# Patient Record
Sex: Female | Born: 1985
Health system: Southern US, Community
[De-identification: ages and names within clinical notes are randomized; demographics above are authoritative.]

## PROBLEM LIST (undated history)

## (undated) DIAGNOSIS — F319 Bipolar disorder, unspecified: Secondary | ICD-10-CM

## (undated) DIAGNOSIS — F988 Other specified behavioral and emotional disorders with onset usually occurring in childhood and adolescence: Secondary | ICD-10-CM

## (undated) DIAGNOSIS — K3184 Gastroparesis: Secondary | ICD-10-CM

## (undated) DIAGNOSIS — E876 Hypokalemia: Secondary | ICD-10-CM

## (undated) DIAGNOSIS — N289 Disorder of kidney and ureter, unspecified: Secondary | ICD-10-CM

## (undated) DIAGNOSIS — J45909 Unspecified asthma, uncomplicated: Secondary | ICD-10-CM

## (undated) HISTORY — PX: TONSILLECTOMY: SUR1361

---

## 2016-11-05 ENCOUNTER — Emergency Department (HOSPITAL_COMMUNITY)
Admission: EM | Admit: 2016-11-05 | Discharge: 2016-11-05 | Disposition: A | Discharge status: Home / Self Care | Payer: Self-pay | Attending: Emergency Medicine | Admitting: Emergency Medicine

## 2016-11-05 ENCOUNTER — Encounter (HOSPITAL_COMMUNITY): Payer: Self-pay

## 2016-11-05 ENCOUNTER — Emergency Department (HOSPITAL_COMMUNITY): Payer: Self-pay

## 2016-11-05 DIAGNOSIS — Y929 Unspecified place or not applicable: Secondary | ICD-10-CM | POA: Insufficient documentation

## 2016-11-05 DIAGNOSIS — S62101A Fracture of unspecified carpal bone, right wrist, initial encounter for closed fracture: Secondary | ICD-10-CM | POA: Insufficient documentation

## 2016-11-05 DIAGNOSIS — J45909 Unspecified asthma, uncomplicated: Secondary | ICD-10-CM | POA: Insufficient documentation

## 2016-11-05 DIAGNOSIS — J069 Acute upper respiratory infection, unspecified: Secondary | ICD-10-CM | POA: Insufficient documentation

## 2016-11-05 DIAGNOSIS — M25531 Pain in right wrist: Secondary | ICD-10-CM

## 2016-11-05 DIAGNOSIS — R05 Cough: Secondary | ICD-10-CM

## 2016-11-05 DIAGNOSIS — Y990 Civilian activity done for income or pay: Secondary | ICD-10-CM | POA: Insufficient documentation

## 2016-11-05 DIAGNOSIS — F909 Attention-deficit hyperactivity disorder, unspecified type: Secondary | ICD-10-CM | POA: Insufficient documentation

## 2016-11-05 DIAGNOSIS — Y939 Activity, unspecified: Secondary | ICD-10-CM | POA: Insufficient documentation

## 2016-11-05 DIAGNOSIS — R059 Cough, unspecified: Secondary | ICD-10-CM

## 2016-11-05 DIAGNOSIS — Z87891 Personal history of nicotine dependence: Secondary | ICD-10-CM | POA: Insufficient documentation

## 2016-11-05 DIAGNOSIS — X58XXXA Exposure to other specified factors, initial encounter: Secondary | ICD-10-CM | POA: Insufficient documentation

## 2016-11-05 HISTORY — DX: Hypokalemia: E87.6

## 2016-11-05 HISTORY — DX: Other specified behavioral and emotional disorders with onset usually occurring in childhood and adolescence: F98.8

## 2016-11-05 HISTORY — DX: Disorder of kidney and ureter, unspecified: N28.9

## 2016-11-05 HISTORY — DX: Bipolar disorder, unspecified: F31.9

## 2016-11-05 HISTORY — DX: Gastroparesis: K31.84

## 2016-11-05 HISTORY — DX: Unspecified asthma, uncomplicated: J45.909

## 2016-11-05 LAB — RAPID STREP SCREEN (MED CTR MEBANE ONLY): STREPTOCOCCUS, GROUP A SCREEN (DIRECT): NEGATIVE

## 2016-11-05 MED ORDER — ALBUTEROL SULFATE HFA 108 (90 BASE) MCG/ACT IN AERS
2.0000 | INHALATION_SPRAY | Freq: Once | RESPIRATORY_TRACT | Status: AC
Start: 2016-11-05 — End: 2016-11-05
  Administered 2016-11-05: 2 via RESPIRATORY_TRACT
  Filled 2016-11-05 (×2): qty 6.7

## 2016-11-05 NOTE — ED Triage Notes (Signed)
Pt c/o sore throat and generalized body aches x 1 day and R wrist pain x "a couple days."  Pain score 10/10.  Pt has not taken anything for symptoms.  Pt reported that she hyperextended wrist at work.

## 2016-11-05 NOTE — ED Provider Notes (Signed)
[facility]-EMERGENCY DEPT Provider Note   CSN: [id number] Arrival date & time: 11/05/16  [id number]  By signing my name below, I, Brandi Howard, attest that this documentation has been prepared under the direction and in the presence of non-physician practitioner, Sherylann Vangorden Camprubi-Soms, PA-C. Electronically Signed: Majel HomerPeyton Howard, Scribe. 11/05/2016. 10:53 AM.  History   Chief Complaint Chief Complaint  Patient presents with  . Sore Throat  . Generalized Body Aches  . Wrist Injury   The history is provided by the patient and medical records. No language interpreter was used.  Sore Throat  This is a new problem. The current episode started 2 days ago. The problem occurs constantly. The problem has been gradually worsening. Pertinent negatives include no chest pain, no abdominal pain and no shortness of breath. The symptoms are aggravated by coughing. Nothing relieves the symptoms. Treatments tried: nyquil. The treatment provided no relief.  Wrist Injury   The incident occurred yesterday. The incident occurred at work. The injury mechanism is unknown. The pain is present in the right wrist. The pain is at a severity of 10/10. Pertinent negatives include no fever. She has tried nothing for the symptoms.   HPI Comments: Brandi Howard is a 30 y.o. female with PMHx of asthma and renal insufficiency, who presents to the Emergency Department for an evaluation of gradually worsening sore throat and generalized myalgias that began 2 days ago. Pt reports she woke up 2 days ago and noticed "white spots" near the back of her throat. 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 She states she went to bed that evening and woke up yesterday morning with generalized body aches. She states associated chills, cough productive of yellow/green mucous, chest tightness secondary to her cough, wheezing, rhinorrhea with yellow drainage, and sensation of ear pressure. Pt reports  her sore throat and chest tightness is exacerbated when breathing in cold air. She notes she has taken NyQuil with no relief and states she does not currently have an albuterol inhaler for her asthma. Denies fevers, CP, SOB, drooling, difficulty swallowing, trismus, ear pain/drainage, abd pain, N/V/D/C, hematuria, dysuria, numbness, tingling, weakness, or rashes.  Pt also complains of gradually worsening, 10/10, right wrist pain that began 2 days ago while at work. She believes she may have "hyperextended" her wrist. She notes associated swelling and "tightness" near her wrist but denies numbness. Denies bruising, or any other symptoms. No prior injury to this wrist that she can recall.  Past Medical History:  Diagnosis Date  . ADD (attention deficit disorder)   . Asthma   . Bipolar disorder (HCC)   . Gastroparesis   . Potassium (K) deficiency   . Renal insufficiency    There are no active problems to display for this patient.  Past Surgical History:  Procedure Laterality Date  . TONSILLECTOMY      OB History    No data available     Home Medications    Prior to Admission medications   Not on File    Family History History reviewed. No pertinent family history.  Social History Social History  Substance Use Topics  . Smoking status: Former Games developermoker  . Smokeless tobacco: Never Used  . Alcohol use No   Allergies   Patient has no allergy information on record.  Review of Systems Review of Systems  Constitutional: Positive for chills. Negative for fever.  HENT: Positive for rhinorrhea and sore throat. Negative  for drooling, ear discharge, ear pain and trouble swallowing.   Respiratory: Positive for cough and wheezing. Negative for shortness of breath.   Cardiovascular: Negative for chest pain.  Gastrointestinal: Negative for abdominal pain, constipation, diarrhea, nausea and vomiting.  Genitourinary: Negative for dysuria and hematuria.  Musculoskeletal: Positive for  arthralgias (right wrist), joint swelling (R wrist) and myalgias (generalized body aches).  Skin: Negative for color change.  Allergic/Immunologic: Negative for immunocompromised state.  Neurological: Negative for weakness and numbness.  Psychiatric/Behavioral: Negative for confusion.  10 Systems reviewed and are negative for acute change except as noted in the HPI.   Physical Exam Updated Vital Signs BP 116/62 (BP Location: Left Arm)   Pulse 70   Temp 98.2 F (36.8 C) (Oral)   Resp 16   Ht 5\' 5"  (1.651 m)   LMP 11/05/2016   SpO2 99%   Physical Exam  Constitutional: She is oriented to person, place, and time. Vital signs are normal. She appears well-developed and well-nourished.  Non-toxic appearance. No distress.  Afebrile, nontoxic, NAD  HENT:  Head: Normocephalic and atraumatic.  Right Ear: Hearing, tympanic membrane, external ear and ear canal normal.  Left Ear: Hearing, tympanic membrane, external ear and ear canal normal.  Nose: Mucosal edema and rhinorrhea present.  Mouth/Throat: Oropharynx is clear and moist and mucous membranes are normal. No trismus in the jaw.  Uvula and tonsils surgically absent. Ears are clear bilaterally. Nose with mild mucosal edema and rhinorrhea. Oropharynx clear and moist, no trismus or drooling, no exudates or erythema.   Eyes: Conjunctivae and EOM are normal. Right eye exhibits no discharge. Left eye exhibits no discharge.  Neck: Normal range of motion. Neck supple.  Cardiovascular: Normal rate, regular rhythm, normal heart sounds and intact distal pulses.  Exam reveals no gallop and no friction rub.   No murmur heard. Pulmonary/Chest: Effort normal and breath sounds normal. No respiratory distress. She has no decreased breath sounds. She has no wheezes. She has no rhonchi. She has no rales.  CTAB in all lung fields, no w/r/r, no hypoxia or increased WOB, speaking in full sentences, SpO2 99% on RA.  Abdominal: Soft. Normal appearance and bowel  sounds are normal. She exhibits no distension. There is no tenderness. There is no rigidity, no rebound, no guarding, no CVA tenderness, no tenderness at McBurney's point and negative Murphy's sign.  Musculoskeletal: Normal range of motion.  Right wrist with FROM itnact, mild tenderness near the radial aspect of the distal wrist, mild swelling, no bruising or erythema, no warmth, with strength and sensation grossly intact, distal pulses intact, soft compartments.   Neurological: She is alert and oriented to person, place, and time. She has normal strength. No sensory deficit.  Skin: Skin is warm, dry and intact. No rash noted.  Psychiatric: She has a normal mood and affect. Her behavior is normal.  Nursing note and vitals reviewed.  ED Treatments / Results  Labs (all labs ordered are listed, but only abnormal results are displayed) Labs Reviewed  RAPID STREP SCREEN (NOT AT [facility])  CULTURE, GROUP A STREP [facility])    EKG  EKG Interpretation None       Radiology Dg Wrist Complete Right  Result Date: 11/05/2016 CLINICAL DATA:  Pain in the right breast of the hyperextension at work 2 days ago. EXAM: RIGHT WRIST - COMPLETE 3+ VIEW COMPARISON:  None. FINDINGS: There is an age-indeterminate compression fracture of the lunate, with associated widening of the scapholunate interval and evidence of lunate collapse.  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 Electronically Signed   By: Ted Mcalpine M.D.   On: 11/05/2016 10:14    Procedures Procedures (including critical care time)  Medications Ordered in ED Medications  albuterol (PROVENTIL HFA;VENTOLIN HFA) 108 (90 Base) MCG/ACT inhaler 2 puff (not administered)    DIAGNOSTIC STUDIES:  Oxygen Saturation is 99% on RA, normal  by my interpretation.    COORDINATION OF CARE:  10:31 AM Discussed treatment plan with pt at bedside and pt agreed to plan.  Initial Impression / Assessment and Plan / ED Course  I have reviewed the triage vital signs and the nursing notes.  Pertinent labs & imaging results that were available during my care of the patient were reviewed by me and considered in my medical decision making (see chart for details).  Clinical Course     30 y.o. female here with URI symptoms x2-3 days, also with c/o R wrist pain after hyperextending it at work. Pt is afebrile with a clear lung exam. Mild rhinorrhea. RST obtained in triage, which was likely unnecessary since pt has had tonsillectomy and uvulaectomy, but regardless it was obtained and negative. Likely viral URI. As for her wrist injury, NVI with soft compartments, ROM preserved, xray showing age-indeterminate fx of lunate, with arthritic findings; likely old injury since MOI recently doesn't lend itself to this outcome, but will splint and have her f/up with hand specialist. RICE discussed. Tylenol/motrin for pain. Pt is agreeable to symptomatic treatment with close follow up with [facility] to establish care in 1-2wks and for recheck but spoke at length about emergent changing or worsening of symptoms that should prompt return to ER. Will give albuterol here to go home with. Pt voices understanding and is agreeable to plan. Stable at time of discharge.   I personally performed the services described in this documentation, which was scribed in my presence. The recorded information has been reviewed and is accurate.   Final Clinical Impressions(s) / ED Diagnoses   Final diagnoses:  Viral upper respiratory tract infection  Cough  Right wrist pain  Closed fracture of right wrist, initial encounter    New Prescriptions New Prescriptions   No medications on file      Allen Derry, PA-C 11/05/16 1101    Charlynne Pander, MD 11/05/16  1729

## 2016-11-05 NOTE — ED Notes (Signed)
Bed: WTR6 Expected date:  Expected time:  Means of arrival:  Comments: 

## 2016-11-05 NOTE — Discharge Instructions (Signed)
Continue to stay well-hydrated. Gargle warm salt water and spit it out. Use chloraseptic spray as needed for sore throat. Continue to alternate between Tylenol and Ibuprofen for pain or fever. Use Mucinex for cough suppression/expectoration of mucus. Use netipot and flonase to help with nasal congestion. May consider over-the-counter Benadryl or other antihistamine to decrease secretions and for help with your symptoms. Use inhaler as directed, as needed for cough/chest congestion/wheezing/shortness of breath. Follow up with [place] in 5-7 days for recheck of ongoing symptoms and to establish medical care. Return to emergency department for emergent changing or worsening of symptoms.  Wear wrist brace for at least 2 weeks for stabilization of wrist. Ice and elevate wrist throughout the day, using ice pack for no more than 20 minutes every hour.  Alternate between tylenol and motrin for pain relief. Call hand specialist follow up today or tomorrow to schedule followup appointment for recheck of ongoing wrist pain in 1-2 weeks. Return to the ER for changes or worsening symptoms.

## 2016-11-07 LAB — CULTURE, GROUP A STREP (THRC)

## 2016-11-17 ENCOUNTER — Inpatient Hospital Stay: Payer: Self-pay

## 2016-11-23 ENCOUNTER — Encounter: Payer: Self-pay | Admitting: Physician Assistant

## 2016-11-23 ENCOUNTER — Ambulatory Visit: Payer: Self-pay | Attending: Internal Medicine | Admitting: Physician Assistant

## 2016-11-23 VITALS — BP 115/77 | HR 78 | Temp 97.1°F | Resp 20 | Ht 65.0 in | Wt 156.0 lb

## 2016-11-23 DIAGNOSIS — N289 Disorder of kidney and ureter, unspecified: Secondary | ICD-10-CM | POA: Insufficient documentation

## 2016-11-23 DIAGNOSIS — F988 Other specified behavioral and emotional disorders with onset usually occurring in childhood and adolescence: Secondary | ICD-10-CM

## 2016-11-23 DIAGNOSIS — Z91040 Latex allergy status: Secondary | ICD-10-CM | POA: Insufficient documentation

## 2016-11-23 DIAGNOSIS — M25531 Pain in right wrist: Secondary | ICD-10-CM | POA: Insufficient documentation

## 2016-11-23 DIAGNOSIS — G4700 Insomnia, unspecified: Secondary | ICD-10-CM | POA: Insufficient documentation

## 2016-11-23 DIAGNOSIS — S62101G Fracture of unspecified carpal bone, right wrist, subsequent encounter for fracture with delayed healing: Secondary | ICD-10-CM

## 2016-11-23 DIAGNOSIS — F319 Bipolar disorder, unspecified: Secondary | ICD-10-CM | POA: Insufficient documentation

## 2016-11-23 DIAGNOSIS — K3184 Gastroparesis: Secondary | ICD-10-CM | POA: Insufficient documentation

## 2016-11-23 DIAGNOSIS — J45909 Unspecified asthma, uncomplicated: Secondary | ICD-10-CM | POA: Insufficient documentation

## 2016-11-23 DIAGNOSIS — J069 Acute upper respiratory infection, unspecified: Secondary | ICD-10-CM

## 2016-11-23 DIAGNOSIS — K219 Gastro-esophageal reflux disease without esophagitis: Secondary | ICD-10-CM | POA: Insufficient documentation

## 2016-11-23 MED ORDER — VENLAFAXINE HCL 75 MG PO TABS: 75.0000 mg | ORAL_TABLET | Freq: Two times a day (BID) | ORAL | 1 refills | Status: DC

## 2016-11-23 MED ORDER — OXYCODONE-ACETAMINOPHEN 5-325 MG PO TABS: 1.0000 | ORAL_TABLET | Freq: Three times a day (TID) | ORAL | 0 refills | Status: DC | PRN

## 2016-11-23 MED ORDER — ASENAPINE MALEATE 10 MG SL SUBL: 20.0000 mg | SUBLINGUAL_TABLET | Freq: Two times a day (BID) | SUBLINGUAL | 0 refills | Status: DC

## 2016-11-23 MED ORDER — PANTOPRAZOLE SODIUM 40 MG PO TBEC: 40.0000 mg | DELAYED_RELEASE_TABLET | Freq: Every day | ORAL | 1 refills | Status: DC

## 2016-11-23 MED ORDER — ALBUTEROL SULFATE HFA 108 (90 BASE) MCG/ACT IN AERS: 2.0000 | INHALATION_SPRAY | Freq: Four times a day (QID) | RESPIRATORY_TRACT | 1 refills | Status: DC | PRN

## 2016-11-23 MED ORDER — DIVALPROEX SODIUM 500 MG PO DR TAB: 500.0000 mg | DELAYED_RELEASE_TABLET | Freq: Two times a day (BID) | ORAL | 1 refills | Status: DC

## 2016-11-23 MED ORDER — TRAZODONE HCL 100 MG PO TABS: 25.0000 mg | ORAL_TABLET | Freq: Every evening | ORAL | 1 refills | Status: DC | PRN

## 2016-11-23 MED ORDER — ONDANSETRON HCL 4 MG/5ML PO SOLN: 4.0000 mg | Freq: Three times a day (TID) | ORAL | 1 refills | Status: AC | PRN

## 2016-11-23 MED ORDER — LISDEXAMFETAMINE DIMESYLATE 20 MG PO CAPS: 20.0000 mg | ORAL_CAPSULE | Freq: Every day | ORAL | 0 refills | Status: AC

## 2016-11-23 MED ORDER — POTASSIUM CHLORIDE CRYS ER 10 MEQ PO TBCR: 10.0000 meq | EXTENDED_RELEASE_TABLET | Freq: Two times a day (BID) | ORAL | 1 refills | Status: DC

## 2016-11-23 NOTE — Progress Notes (Signed)
Right wrist Tried tylenol/motrin for pain

## 2016-11-23 NOTE — Progress Notes (Signed)
Brandi Howard  ZOX:[id number]  [id number]  DOB - [birth year]  Chief Complaint  Patient presents with  . Follow-up  . URI  . Wrist Pain       Subjective:   Brandi Howard is a 31 y.o. female here today for Establishment of care. She recently moved here from [place], [place]. She has a history of asthma, renal insufficiency, hypokalemia, GERD/gastroparesis, ADD, bipolar disorder and was in the emergency department on 11/05/2016. She had 2 complaints at that time. The first was body aches and sore throat. She also felt like she saw white spots in the back of her throat. She has a history of tonsillectomy. Over-the-counter treatments were not helping. She was diagnosed with a viral syndrome and treated symptomatically.  She also complained of right wrist pain and swelling. It was 10 on a scale of 1-10. She feels like she hyperextended her wrist at work. An x-ray showed a compression fracture of the lunate and mild osteoarthritis. She was given a splint and referred to work though. Or told that her x-ray remotely and said that she did not need to come in.  She continues with pain in the right wrist. She's wearing the splint all the time. She is requesting medication for pain and to be referred to someone who can help her.  She also is out of all of her bipolar/ADD medications and requesting refill.  She also states she does not sleep well.   She has been out of work since late December and is needing a work note.  ROS:-benign GEN: denies fever or chills, denies change in weight Skin: denies lesions or rashes HEENT: denies headache, earache, epistaxis, sore throat, or neck pain LUNGS: denies SHOB, dyspnea, PND, orthopnea CV: denies CP or palpitations ABD: denies abd pain, N or V EXT: denies muscle spasms or swelling; no pain in lower ext, no weakness (splint right wrist) NEURO: denies numbness or tingling, denies sz, stroke or TIA   ALLERGIES: Allergies  Allergen  Reactions  . Coconut Oil Anaphylaxis  . Latex Rash    PAST MEDICAL HISTORY: Past Medical History:  Diagnosis Date  . ADD (attention deficit disorder)   . Asthma   . Bipolar disorder (HCC)   . Gastroparesis   . Potassium (K) deficiency   . Renal insufficiency     PAST SURGICAL HISTORY: Past Surgical History:  Procedure Laterality Date  . TONSILLECTOMY      MEDICATIONS AT HOME: Prior to Admission medications   Medication Sig Start Date End Date Taking? Authorizing Provider  albuterol (PROVENTIL HFA;VENTOLIN HFA) 108 (90 Base) MCG/ACT inhaler Inhale 2 puffs into the lungs every 6 (six) hours as needed for wheezing or shortness of breath. 11/23/16   Bryona Foxworthy Netta Cedars, PA-C  Asenapine Maleate (SAPHRIS) 10 MG SUBL Place 2 tablets (20 mg total) under the tongue 2 (two) times daily. 11/23/16   Vivianne Master, PA-C  divalproex (DEPAKOTE) 500 MG DR tablet Take 1 tablet (500 mg total) by mouth 2 (two) times daily. 11/23/16   Kerina Simoneau Netta Cedars, PA-C  lisdexamfetamine (VYVANSE) 20 MG capsule Take 1 capsule (20 mg total) by mouth daily. 11/23/16   Tamilyn Lupien Netta Cedars, PA-C  ondansetron (ZOFRAN) 4 MG/5ML solution Take 5 mLs (4 mg total) by mouth every 8 (eight) hours as needed for nausea or vomiting. 11/23/16   Demarion Pondexter Netta Cedars, PA-C  oxyCODONE-acetaminophen (ROXICET) 5-325 MG tablet Take 1 tablet by mouth every 8 (eight) hours as needed for severe pain. 11/23/16  Vivianne Masteriffany S Brena Windsor, PA-C  pantoprazole (PROTONIX) 40 MG tablet Take 1 tablet (40 mg total) by mouth daily. 11/23/16   Christle Nolting Netta CedarsS Kalaysia Demonbreun, PA-C  potassium chloride (K-DUR,KLOR-CON) 10 MEQ tablet Take 1 tablet (10 mEq total) by mouth 2 (two) times daily. 11/23/16   Felicia Both Netta CedarsS Bow Buntyn, PA-C  traZODone (DESYREL) 100 MG tablet Take 0.5 tablets (50 mg total) by mouth at bedtime as needed for sleep. 11/23/16   Vivianne Masteriffany S Earline Stiner, PA-C  venlafaxine (EFFEXOR) 75 MG tablet Take 1 tablet (75 mg total) by mouth 2 (two) times daily with a meal. 11/23/16   Vivianne Masteriffany S Mozella Rexrode, PA-C      Objective:   Vitals:   11/23/16 1410  BP: 115/77  Pulse: 78  Resp: 20  Temp: 97.1 F (36.2 C)  TempSrc: Oral  SpO2: 98%  Weight: 156 lb (70.8 kg)  Height: 5\' 5"  (1.651 m)    Exam General appearance : Awake, alert, not in any distress. Speech Clear. Not toxic looking HEENT: Atraumatic and Normocephalic, pupils equally reactive to light and accomodation Neck: supple, no JVD. No cervical lymphadenopathy.  Chest:Good air entry bilaterally, no added sounds  CVS: S1 S2 regular, no murmurs.  Abdomen: Bowel sounds present, Non tender and not distended with no gaurding, rigidity or rebound. Extremities: B/L Lower Ext shows no edema, both legs are warm to touch Neurology: Awake alert, and oriented X 3, CN II-XII intact, Non focal Skin:No Rash Wounds:N/A   Assessment & Plan  1. Viral syndrome/URI-resolved  2. Right wrist pain/?old fx/OA  -prn pain meds  -referral to hand specialist 3. ADD/Bipolar D/O  -refilled all meds 4. Insomnia  -added Trazadone 5. GERD  -Protonix  2 weeks f/u Financial counselor  The patient was given clear instructions to go to ER or return to medical center if symptoms don't improve, worsen or new problems develop. The patient verbalized understanding. The patient was told to call to get lab results if they haven't heard anything in the next week.   This note has been created with Education officer, environmentalDragon speech recognition software and smart phrase technology. Any transcriptional errors are unintentional.    Scot Juniffany Jourdin Gens, PA-C [facility] [place], [place] [phone]   11/23/2016, 2:58 PM

## 2016-11-28 ENCOUNTER — Emergency Department (HOSPITAL_COMMUNITY)
Admission: EM | Admit: 2016-11-28 | Discharge: 2016-11-28 | Disposition: A | Discharge status: Home / Self Care | Payer: Self-pay | Attending: Emergency Medicine | Admitting: Emergency Medicine

## 2016-11-28 ENCOUNTER — Emergency Department (HOSPITAL_COMMUNITY): Payer: Self-pay

## 2016-11-28 ENCOUNTER — Encounter (HOSPITAL_COMMUNITY): Payer: Self-pay | Admitting: Emergency Medicine

## 2016-11-28 DIAGNOSIS — Z5321 Procedure and treatment not carried out due to patient leaving prior to being seen by health care provider: Secondary | ICD-10-CM | POA: Insufficient documentation

## 2016-11-28 DIAGNOSIS — R0602 Shortness of breath: Secondary | ICD-10-CM | POA: Insufficient documentation

## 2016-11-28 LAB — CBC
HCT: 35.3 % — ABNORMAL LOW (ref 36.0–46.0)
HEMOGLOBIN: 12.1 g/dL (ref 12.0–15.0)
MCH: 27.8 pg (ref 26.0–34.0)
MCHC: 34.3 g/dL (ref 30.0–36.0)
MCV: 81.1 fL (ref 78.0–100.0)
Platelets: 500 10*3/uL — ABNORMAL HIGH (ref 150–400)
RBC: 4.35 MIL/uL (ref 3.87–5.11)
RDW: 14 % (ref 11.5–15.5)
WBC: 10.3 10*3/uL (ref 4.0–10.5)

## 2016-11-28 LAB — COMPREHENSIVE METABOLIC PANEL
ALT: 7 U/L — ABNORMAL LOW (ref 14–54)
ANION GAP: 9 (ref 5–15)
AST: 16 U/L (ref 15–41)
Albumin: 4.2 g/dL (ref 3.5–5.0)
Alkaline Phosphatase: 57 U/L (ref 38–126)
BUN: 15 mg/dL (ref 6–20)
CHLORIDE: 88 mmol/L — AB (ref 101–111)
CO2: 37 mmol/L — AB (ref 22–32)
Calcium: 9.6 mg/dL (ref 8.9–10.3)
Creatinine, Ser: 1.08 mg/dL — ABNORMAL HIGH (ref 0.44–1.00)
GFR calc non Af Amer: >60 mL/min (ref >60)
Glucose, Bld: 87 mg/dL (ref 65–99)
POTASSIUM: 2.7 mmol/L — AB (ref 3.5–5.1)
SODIUM: 134 mmol/L — AB (ref 135–145)
Total Bilirubin: 0.5 mg/dL (ref 0.3–1.2)
Total Protein: 8.3 g/dL — ABNORMAL HIGH (ref 6.5–8.1)

## 2016-11-28 LAB — LIPASE, BLOOD: LIPASE: 35 U/L (ref 11–51)

## 2016-11-28 LAB — MAGNESIUM: Magnesium: 2.3 mg/dL (ref 1.7–2.4)

## 2016-11-28 NOTE — ED Notes (Signed)
Called pt for room replacement no answer. 

## 2016-11-28 NOTE — ED Notes (Signed)
Verbal from TreynorNanavati to add magnesium.

## 2016-11-28 NOTE — ED Notes (Signed)
Called from lobby; no response. 

## 2016-11-28 NOTE — ED Notes (Signed)
Name called to lobby with no response. 

## 2016-11-28 NOTE — ED Triage Notes (Signed)
Pt complaint of flu like symptoms since Friday with associated n/v/d and productive cough. Pt verbalizes hx of hypokalemia and since onset of flu like symptoms "having flutters."

## 2016-12-04 ENCOUNTER — Emergency Department (HOSPITAL_COMMUNITY): Payer: Self-pay

## 2016-12-04 ENCOUNTER — Emergency Department (HOSPITAL_COMMUNITY)
Admission: EM | Admit: 2016-12-04 | Discharge: 2016-12-04 | Disposition: A | Discharge status: Home / Self Care | Payer: Self-pay | Attending: Emergency Medicine | Admitting: Emergency Medicine

## 2016-12-04 DIAGNOSIS — Z87891 Personal history of nicotine dependence: Secondary | ICD-10-CM | POA: Insufficient documentation

## 2016-12-04 DIAGNOSIS — Z79899 Other long term (current) drug therapy: Secondary | ICD-10-CM | POA: Insufficient documentation

## 2016-12-04 DIAGNOSIS — N289 Disorder of kidney and ureter, unspecified: Secondary | ICD-10-CM | POA: Insufficient documentation

## 2016-12-04 DIAGNOSIS — G8929 Other chronic pain: Secondary | ICD-10-CM | POA: Insufficient documentation

## 2016-12-04 DIAGNOSIS — E876 Hypokalemia: Secondary | ICD-10-CM | POA: Insufficient documentation

## 2016-12-04 DIAGNOSIS — R112 Nausea with vomiting, unspecified: Secondary | ICD-10-CM

## 2016-12-04 DIAGNOSIS — N189 Chronic kidney disease, unspecified: Secondary | ICD-10-CM

## 2016-12-04 DIAGNOSIS — J45909 Unspecified asthma, uncomplicated: Secondary | ICD-10-CM | POA: Insufficient documentation

## 2016-12-04 LAB — URINALYSIS, ROUTINE W REFLEX MICROSCOPIC
Glucose, UA: NEGATIVE mg/dL
KETONES UR: 20 mg/dL — AB
Leukocytes, UA: NEGATIVE
Nitrite: NEGATIVE
PH: 5 (ref 5.0–8.0)
PROTEIN: 100 mg/dL — AB
Specific Gravity, Urine: 1.031 — ABNORMAL HIGH (ref 1.005–1.030)

## 2016-12-04 LAB — CBC
HCT: 41.5 % (ref 36.0–46.0)
HEMOGLOBIN: 14.4 g/dL (ref 12.0–15.0)
MCH: 27.7 pg (ref 26.0–34.0)
MCHC: 34.7 g/dL (ref 30.0–36.0)
MCV: 80 fL (ref 78.0–100.0)
Platelets: 425 10*3/uL — ABNORMAL HIGH (ref 150–400)
RBC: 5.19 MIL/uL — AB (ref 3.87–5.11)
RDW: 13.6 % (ref 11.5–15.5)
WBC: 8.8 10*3/uL (ref 4.0–10.5)

## 2016-12-04 LAB — COMPREHENSIVE METABOLIC PANEL
ALBUMIN: 4.2 g/dL (ref 3.5–5.0)
ALK PHOS: 55 U/L (ref 38–126)
ALT: 11 U/L — ABNORMAL LOW (ref 14–54)
ANION GAP: 20 — AB (ref 5–15)
AST: 25 U/L (ref 15–41)
BUN: 24 mg/dL — ABNORMAL HIGH (ref 6–20)
CALCIUM: 9.8 mg/dL (ref 8.9–10.3)
CO2: 32 mmol/L (ref 22–32)
Chloride: 81 mmol/L — ABNORMAL LOW (ref 101–111)
Creatinine, Ser: 1.49 mg/dL — ABNORMAL HIGH (ref 0.44–1.00)
GFR calc non Af Amer: 46 mL/min — ABNORMAL LOW (ref >60)
GFR, EST AFRICAN AMERICAN: 54 mL/min — AB (ref >60)
GLUCOSE: 100 mg/dL — AB (ref 65–99)
Potassium: 2.4 mmol/L — CL (ref 3.5–5.1)
SODIUM: 133 mmol/L — AB (ref 135–145)
Total Bilirubin: 0.6 mg/dL (ref 0.3–1.2)
Total Protein: 8.9 g/dL — ABNORMAL HIGH (ref 6.5–8.1)

## 2016-12-04 LAB — LIPASE, BLOOD: LIPASE: 22 U/L (ref 11–51)

## 2016-12-04 LAB — MAGNESIUM: Magnesium: 2.6 mg/dL — ABNORMAL HIGH (ref 1.7–2.4)

## 2016-12-04 MED ORDER — POTASSIUM CHLORIDE CRYS ER 20 MEQ PO TBCR: 20.0000 meq | EXTENDED_RELEASE_TABLET | Freq: Two times a day (BID) | ORAL | 0 refills | Status: DC

## 2016-12-04 MED ORDER — HYDROCODONE-ACETAMINOPHEN 5-325 MG PO TABS: 1.0000 | ORAL_TABLET | ORAL | 0 refills | Status: DC | PRN

## 2016-12-04 MED ORDER — MORPHINE SULFATE (PF) 4 MG/ML IV SOLN
4.0000 mg | Freq: Once | INTRAVENOUS | Status: AC
  Administered 2016-12-04: 4 mg via INTRAMUSCULAR
  Filled 2016-12-04: qty 1

## 2016-12-04 MED ORDER — PROMETHAZINE HCL 25 MG/ML IJ SOLN
25.0000 mg | Freq: Once | INTRAMUSCULAR | Status: AC
  Administered 2016-12-04: 25 mg via INTRAMUSCULAR
  Filled 2016-12-04: qty 1

## 2016-12-04 MED ORDER — MORPHINE SULFATE (PF) 4 MG/ML IV SOLN: 4.0000 mg | Freq: Once | INTRAVENOUS | Status: DC

## 2016-12-04 MED ORDER — POTASSIUM CHLORIDE CRYS ER 20 MEQ PO TBCR
40.0000 meq | EXTENDED_RELEASE_TABLET | Freq: Once | ORAL | Status: AC
  Administered 2016-12-04: 40 meq via ORAL
  Filled 2016-12-04: qty 2

## 2016-12-04 MED ORDER — PROMETHAZINE HCL 25 MG PO TABS: 25.0000 mg | ORAL_TABLET | Freq: Four times a day (QID) | ORAL | 0 refills | Status: DC | PRN

## 2016-12-04 MED ORDER — ONDANSETRON 4 MG PO TBDP
4.0000 mg | ORAL_TABLET | Freq: Once | ORAL | Status: AC
  Administered 2016-12-04: 4 mg via ORAL
  Filled 2016-12-04: qty 1

## 2016-12-04 MED ORDER — SODIUM CHLORIDE 0.9 % IV BOLUS (SEPSIS): 1000.0000 mL | Freq: Once | INTRAVENOUS | Status: DC

## 2016-12-04 MED ORDER — ONDANSETRON HCL 4 MG/2ML IJ SOLN
4.0000 mg | Freq: Once | INTRAMUSCULAR | Status: DC
Start: 2016-12-04 — End: 2016-12-04

## 2016-12-04 NOTE — ED Triage Notes (Signed)
States has been vomiting  X 1 week and 1/2 and having spasms in her muscles, has hx of gastric parisis

## 2016-12-04 NOTE — ED Notes (Signed)
Per pts wife, pt has been taking sips of ginger ale.  Is sleeping at this time.

## 2016-12-04 NOTE — ED Notes (Signed)
Pt requesting prescription for pain medication.  Prescription obtained from Dr. Particia NearingHaviland.

## 2016-12-04 NOTE — ED Provider Notes (Signed)
[facility]-EMERGENCY DEPT Provider Note   CSN: [id number] Arrival date & time: 12/04/16  1407     History   Chief Complaint Chief Complaint  Patient presents with  . Cough  . Emesis    HPI Brandi Howard is a 31 y.o. female.  Pt presents to the ED today with n/v.  The pt said she's been vomiting for 1 week.  She feels like she's having spasms in her muscles.  She does have a hx of low potassium.  The pt said she's unable to keep any fluid down.  The pt has had some diarrhea, but not a lot.      Past Medical History:  Diagnosis Date  . ADD (attention deficit disorder)   . Asthma   . Bipolar disorder (HCC)   . Gastroparesis   . Potassium (K) deficiency   . Renal insufficiency     There are no active problems to display for this patient.   Past Surgical History:  Procedure Laterality Date  . TONSILLECTOMY      OB History    No data available       Home Medications    Prior to Admission medications   Medication Sig Start Date End Date Taking? Authorizing Provider  acetaminophen (TYLENOL) 500 MG tablet Take 1,500 mg by mouth every 6 (six) hours as needed for headache (pain).   Yes Historical Provider, MD  medroxyPROGESTERone (DEPO-PROVERA) 150 MG/ML injection Inject 150 mg into the muscle every 3 (three) months.   Yes Historical Provider, MD  oxyCODONE-acetaminophen (ROXICET) 5-325 MG tablet Take 1 tablet by mouth every 8 (eight) hours as needed for severe pain. 11/23/16  Yes Tiffany Netta Cedars, PA-C  albuterol (PROVENTIL HFA;VENTOLIN HFA) 108 (90 Base) MCG/ACT inhaler Inhale 2 puffs into the lungs every 6 (six) hours as needed for wheezing or shortness of breath. Patient not taking: Reported on 12/04/2016 11/23/16   Vivianne Master, PA-C  Asenapine Maleate (SAPHRIS) 10 MG SUBL Place 2 tablets (20 mg total) under the tongue 2 (two) times daily. Patient not taking: Reported on 12/04/2016 11/23/16   Vivianne Master, PA-C  divalproex (DEPAKOTE) 500 MG DR tablet Take 1 tablet (500 mg  total) by mouth 2 (two) times daily. Patient not taking: Reported on 12/04/2016 11/23/16   Vivianne Master, PA-C  lisdexamfetamine (VYVANSE) 20 MG capsule Take 1 capsule (20 mg total) by mouth daily. Patient not taking: Reported on 12/04/2016 11/23/16   Vivianne Master, PA-C  ondansetron [facility]) 4 MG/5ML solution Take 5 mLs (4 mg total) by mouth every 8 (eight) hours as needed for nausea or vomiting. Patient not taking: Reported on 12/04/2016 11/23/16   Vivianne Master, PA-C  pantoprazole (PROTONIX) 40 MG tablet Take 1 tablet (40 mg total) by mouth daily. Patient not taking: Reported on 12/04/2016 11/23/16   Vivianne Master, PA-C  potassium chloride (K-DUR,KLOR-CON) 20 MEQ tablet Take 1 tablet (20 mEq total) by mouth 2 (two) times daily. 12/04/16   Jacalyn Lefevre, MD  promethazine (PHENERGAN) 25 MG tablet Take 1 tablet (25 mg total) by mouth every 6 (six) hours as needed for nausea or vomiting. 12/04/16   Jacalyn Lefevre, MD  traZODone (DESYREL) 100 MG tablet Take 0.5 tablets (50 mg total) by mouth at bedtime as needed for sleep. Patient not taking: Reported on 12/04/2016 11/23/16   Vivianne Master, PA-C  venlafaxine (EFFEXOR) 75 MG tablet Take 1 tablet (75 mg total) by mouth 2 (two) times daily with a meal. Patient not  taking: Reported on 12/04/2016 11/23/16   Vivianne Masteriffany S Noel, PA-C    Family History No family history on file.  Social History Social History  Substance Use Topics  . Smoking status: Former Games developermoker  . Smokeless tobacco: Never Used  . Alcohol use No     Allergies   Coconut oil; Pineapple; and Latex   Review of Systems Review of Systems  Gastrointestinal: Positive for abdominal pain, diarrhea, nausea and vomiting.  All other systems reviewed and are negative.    Physical Exam Updated Vital Signs BP 102/69   Pulse 65   Temp 98.2 F (36.8 C) (Oral)   Resp 16   LMP 11/28/2016   SpO2 100%   Physical Exam  Constitutional: She is oriented to person, place, and time. She appears  well-developed and well-nourished.  HENT:  Head: Normocephalic and atraumatic.  Right Ear: External ear normal.  Left Ear: External ear normal.  Nose: Nose normal.  Mouth/Throat: Oropharynx is clear and moist.  Eyes: Conjunctivae and EOM are normal. Pupils are equal, round, and reactive to light.  Neck: Normal range of motion. Neck supple.  Cardiovascular: Normal rate, regular rhythm, normal heart sounds and intact distal pulses.   Pulmonary/Chest: Effort normal and breath sounds normal.  Abdominal: Soft. Bowel sounds are normal. There is tenderness in the epigastric area.  Musculoskeletal: Normal range of motion.  Neurological: She is alert and oriented to person, place, and time.  Skin: Skin is warm.  Psychiatric: Her speech is normal. Her mood appears anxious. She is agitated.  Nursing note and vitals reviewed.    ED Treatments / Results  Labs (all labs ordered are listed, but only abnormal results are displayed) Labs Reviewed  COMPREHENSIVE METABOLIC PANEL - Abnormal; Notable for the following:       Result Value   Sodium 133 (*)    Potassium 2.4 (*)    Chloride 81 (*)    Glucose, Bld 100 (*)    BUN 24 (*)    Creatinine, Ser 1.49 (*)    Total Protein 8.9 (*)    ALT 11 (*)    GFR calc non Af Amer 46 (*)    GFR calc Af Amer 54 (*)    Anion gap 20 (*)    All other components within normal limits  CBC - Abnormal; Notable for the following:    RBC 5.19 (*)    Platelets 425 (*)    All other components within normal limits  URINALYSIS, ROUTINE W REFLEX MICROSCOPIC - Abnormal; Notable for the following:    Color, Urine AMBER (*)    APPearance CLOUDY (*)    Specific Gravity, Urine 1.031 (*)    Hgb urine dipstick LARGE (*)    Bilirubin Urine SMALL (*)    Ketones, ur 20 (*)    Protein, ur 100 (*)    Bacteria, UA RARE (*)    Squamous Epithelial / LPF 6-30 (*)    All other components within normal limits  MAGNESIUM - Abnormal; Notable for the following:    Magnesium 2.6  (*)    All other components within normal limits  LIPASE, BLOOD   Pt is on her period which is why she has hematuria.  EKG  EKG Interpretation None       Radiology Dg Chest 2 View  Result Date: 12/04/2016 CLINICAL DATA:  Chest pain, shortness of breath, and cough for 10 days. Asthma. EXAM: CHEST  2 VIEW COMPARISON:  11/28/2016 FINDINGS: The heart size and mediastinal  contours are within normal limits. Both lungs are clear. No evidence of pneumothorax or pleural effusion. The visualized skeletal structures are unremarkable. IMPRESSION: No active cardiopulmonary disease. Electronically Signed   By: Myles Rosenthal M.D.   On: 12/04/2016 18:10    Procedures Procedures (including critical care time)  Medications Ordered in ED Medications  sodium chloride 0.9 % bolus 1,000 mL (not administered)  potassium chloride SA (K-DUR,KLOR-CON) CR tablet 40 mEq (not administered)  ondansetron (ZOFRAN-ODT) disintegrating tablet 4 mg (4 mg Oral Given 12/04/16 1837)  morphine 4 MG/ML injection 4 mg (4 mg Intramuscular Given 12/04/16 1838)  promethazine (PHENERGAN) injection 25 mg (25 mg Intramuscular Given 12/04/16 1946)     Initial Impression / Assessment and Plan / ED Course  I have reviewed the triage vital signs and the nursing notes.  Pertinent labs & imaging results that were available during my care of the patient were reviewed by me and considered in my medical decision making (see chart for details).    Pt is feeling much better.  She is able to tolerate po fluids.  She knows to return if worse.  She is also told she needs to establish pcp.  Final Clinical Impressions(s) / ED Diagnoses   Final diagnoses:  Non-intractable vomiting with nausea, unspecified vomiting type  Hypokalemia    New Prescriptions New Prescriptions   PROMETHAZINE (PHENERGAN) 25 MG TABLET    Take 1 tablet (25 mg total) by mouth every 6 (six) hours as needed for nausea or vomiting.     Jacalyn Lefevre, MD 12/04/16  2042

## 2016-12-04 NOTE — ED Notes (Signed)
Pt refusing to allow this RN to attempt IV access. Pt called this RN "a prick." Pt requesting a different RN at this time.

## 2016-12-08 ENCOUNTER — Emergency Department (HOSPITAL_COMMUNITY): Payer: Self-pay

## 2016-12-08 ENCOUNTER — Encounter (HOSPITAL_COMMUNITY): Payer: Self-pay | Admitting: Emergency Medicine

## 2016-12-08 ENCOUNTER — Emergency Department (HOSPITAL_COMMUNITY)
Admission: EM | Admit: 2016-12-08 | Discharge: 2016-12-08 | Disposition: A | Discharge status: Home / Self Care | Payer: Self-pay | Attending: Emergency Medicine | Admitting: Emergency Medicine

## 2016-12-08 DIAGNOSIS — R112 Nausea with vomiting, unspecified: Secondary | ICD-10-CM | POA: Insufficient documentation

## 2016-12-08 DIAGNOSIS — Z87891 Personal history of nicotine dependence: Secondary | ICD-10-CM | POA: Insufficient documentation

## 2016-12-08 DIAGNOSIS — F909 Attention-deficit hyperactivity disorder, unspecified type: Secondary | ICD-10-CM | POA: Insufficient documentation

## 2016-12-08 DIAGNOSIS — E876 Hypokalemia: Secondary | ICD-10-CM | POA: Insufficient documentation

## 2016-12-08 DIAGNOSIS — J45909 Unspecified asthma, uncomplicated: Secondary | ICD-10-CM | POA: Insufficient documentation

## 2016-12-08 DIAGNOSIS — R1084 Generalized abdominal pain: Secondary | ICD-10-CM | POA: Insufficient documentation

## 2016-12-08 DIAGNOSIS — Z91040 Latex allergy status: Secondary | ICD-10-CM | POA: Insufficient documentation

## 2016-12-08 DIAGNOSIS — Z79899 Other long term (current) drug therapy: Secondary | ICD-10-CM | POA: Insufficient documentation

## 2016-12-08 LAB — CBC
HCT: 37.8 % (ref 36.0–46.0)
Hemoglobin: 13.1 g/dL (ref 12.0–15.0)
MCH: 27.7 pg (ref 26.0–34.0)
MCHC: 34.7 g/dL (ref 30.0–36.0)
MCV: 79.9 fL (ref 78.0–100.0)
PLATELETS: 426 10*3/uL — AB (ref 150–400)
RBC: 4.73 MIL/uL (ref 3.87–5.11)
RDW: 13.9 % (ref 11.5–15.5)
WBC: 10 10*3/uL (ref 4.0–10.5)

## 2016-12-08 LAB — RAPID URINE DRUG SCREEN, HOSP PERFORMED
AMPHETAMINES: NOT DETECTED
BENZODIAZEPINES: NOT DETECTED
Barbiturates: NOT DETECTED
Cocaine: NOT DETECTED
Opiates: NOT DETECTED
Tetrahydrocannabinol: POSITIVE — AB

## 2016-12-08 LAB — URINALYSIS, ROUTINE W REFLEX MICROSCOPIC
Bacteria, UA: NONE SEEN
Bilirubin Urine: NEGATIVE
Glucose, UA: NEGATIVE mg/dL
Hgb urine dipstick: NEGATIVE
KETONES UR: NEGATIVE mg/dL
Nitrite: NEGATIVE
PROTEIN: 100 mg/dL — AB
SPECIFIC GRAVITY, URINE: 1.021 (ref 1.005–1.030)
pH: 9 — ABNORMAL HIGH (ref 5.0–8.0)

## 2016-12-08 LAB — BASIC METABOLIC PANEL
Anion gap: 9 (ref 5–15)
BUN: 22 mg/dL — ABNORMAL HIGH (ref 6–20)
CALCIUM: 9.6 mg/dL (ref 8.9–10.3)
CO2: 35 mmol/L — AB (ref 22–32)
CREATININE: 1 mg/dL (ref 0.44–1.00)
Chloride: 92 mmol/L — ABNORMAL LOW (ref 101–111)
GFR calc Af Amer: >60 mL/min (ref >60)
GFR calc non Af Amer: >60 mL/min (ref >60)
GLUCOSE: 104 mg/dL — AB (ref 65–99)
Potassium: 2.9 mmol/L — ABNORMAL LOW (ref 3.5–5.1)
Sodium: 136 mmol/L (ref 135–145)

## 2016-12-08 LAB — I-STAT TROPONIN, ED: TROPONIN I, POC: 0 ng/mL (ref 0.00–0.08)

## 2016-12-08 LAB — PREGNANCY, URINE: PREG TEST UR: NEGATIVE

## 2016-12-08 MED ORDER — SODIUM CHLORIDE 0.9 % IV BOLUS (SEPSIS)
1000.0000 mL | Freq: Once | INTRAVENOUS | Status: AC
  Administered 2016-12-08: 1000 mL via INTRAVENOUS

## 2016-12-08 MED ORDER — PROMETHAZINE HCL 25 MG PO TABS: 25.0000 mg | ORAL_TABLET | Freq: Four times a day (QID) | ORAL | 0 refills | Status: DC | PRN

## 2016-12-08 MED ORDER — POTASSIUM CHLORIDE CRYS ER 20 MEQ PO TBCR: 20.0000 meq | EXTENDED_RELEASE_TABLET | Freq: Two times a day (BID) | ORAL | 0 refills | Status: DC

## 2016-12-08 MED ORDER — MORPHINE SULFATE (PF) 4 MG/ML IV SOLN
4.0000 mg | Freq: Once | INTRAVENOUS | Status: DC
  Filled 2016-12-08: qty 1

## 2016-12-08 MED ORDER — POTASSIUM CHLORIDE CRYS ER 20 MEQ PO TBCR
40.0000 meq | EXTENDED_RELEASE_TABLET | Freq: Once | ORAL | Status: AC
  Administered 2016-12-08: 40 meq via ORAL
  Filled 2016-12-08: qty 2

## 2016-12-08 MED ORDER — PROMETHAZINE HCL 25 MG/ML IJ SOLN
25.0000 mg | Freq: Once | INTRAMUSCULAR | Status: AC
  Administered 2016-12-08: 25 mg via INTRAVENOUS
  Filled 2016-12-08: qty 1

## 2016-12-08 NOTE — ED Provider Notes (Signed)
[facility]-EMERGENCY DEPT Provider Note   CSN: [id number] Arrival date & time: 12/08/16  1142     History   Chief Complaint Chief Complaint  Patient presents with  . Chest Pain  . Low potassium    HPI Brandi Howard is a 31 y.o. female.  Pt presents to the ED with n/v and abdominal pain.  She was seen by me on 1/21 for the same.  She does have a hx of CRI and low potassium.  On the 21st, she was given zofran and phenergan and was feeling much better.  She was able to tolerate po potassium, so she did not require IV potassium.  She told triage I would not give it to her.  The pt has not gotten any of her prescriptions filled given to her on the 21st.       Past Medical History:  Diagnosis Date  . ADD (attention deficit disorder)   . Asthma   . Bipolar disorder (HCC)   . Gastroparesis   . Potassium (K) deficiency   . Renal insufficiency     There are no active problems to display for this patient.   Past Surgical History:  Procedure Laterality Date  . TONSILLECTOMY      OB History    No data available       Home Medications    Prior to Admission medications   Medication Sig Start Date End Date Taking? Authorizing Provider  acetaminophen (TYLENOL) 500 MG tablet Take 1,500 mg by mouth every 6 (six) hours as needed for headache (pain).   Yes Historical Provider, MD  albuterol (PROVENTIL HFA;VENTOLIN HFA) 108 (90 Base) MCG/ACT inhaler Inhale 2 puffs into the lungs every 6 (six) hours as needed for wheezing or shortness of breath. 11/23/16  Yes Tiffany Netta Cedars, PA-C  HYDROcodone-acetaminophen (NORCO/VICODIN) 5-325 MG tablet Take 1 tablet by mouth every 4 (four) hours as needed. Patient taking differently: Take 1 tablet by mouth every 4 (four) hours as needed for moderate pain or severe pain.  12/04/16  Yes Jacalyn Lefevre, MD  Asenapine Maleate (SAPHRIS) 10 MG SUBL Place 2 tablets (20 mg total) under the tongue 2 (two) times daily. Patient not taking: Reported on 12/04/2016  11/23/16   Vivianne Master, PA-C  divalproex (DEPAKOTE) 500 MG DR tablet Take 1 tablet (500 mg total) by mouth 2 (two) times daily. Patient not taking: Reported on 12/04/2016 11/23/16   Vivianne Master, PA-C  lisdexamfetamine (VYVANSE) 20 MG capsule Take 1 capsule (20 mg total) by mouth daily. Patient not taking: Reported on 12/04/2016 11/23/16   Vivianne Master, PA-C  medroxyPROGESTERone (DEPO-PROVERA) 150 MG/ML injection Inject 150 mg into the muscle every 3 (three) months.    Historical Provider, MD  ondansetron (ZOFRAN) 4 MG/5ML solution Take 5 mLs (4 mg total) by mouth every 8 (eight) hours as needed for nausea or vomiting. Patient not taking: Reported on 12/04/2016 11/23/16   Vivianne Master, PA-C  oxyCODONE-acetaminophen (ROXICET) 5-325 MG tablet Take 1 tablet by mouth every 8 (eight) hours as needed for severe pain. Patient not taking: Reported on 12/08/2016 11/23/16   Vivianne Master, PA-C  pantoprazole (PROTONIX) 40 MG tablet Take 1 tablet (40 mg total) by mouth daily. Patient not taking: Reported on 12/04/2016 11/23/16   Vivianne Master, PA-C  potassium chloride SA (K-DUR,KLOR-CON) 20 MEQ tablet Take 1 tablet (20 mEq total) by mouth 2 (two) times daily. 12/08/16   Jacalyn Lefevre, MD  promethazine (PHENERGAN) 25 MG tablet Take  1 tablet (25 mg total) by mouth every 6 (six) hours as needed for nausea or vomiting. 12/08/16   Jacalyn Lefevre, MD  traZODone (DESYREL) 100 MG tablet Take 0.5 tablets (50 mg total) by mouth at bedtime as needed for sleep. Patient not taking: Reported on 12/04/2016 11/23/16   Vivianne Master, PA-C  venlafaxine [facility]) 75 MG tablet Take 1 tablet (75 mg total) by mouth 2 (two) times daily with a meal. Patient not taking: Reported on 12/04/2016 11/23/16   Vivianne Master, PA-C    Family History History reviewed. No pertinent family history.  Social History Social History  Substance Use Topics  . Smoking status: Former Games developer  . Smokeless tobacco: Never Used  . Alcohol use No      Allergies   Coconut oil; Pineapple; and Latex   Review of Systems Review of Systems  Gastrointestinal: Positive for abdominal pain, nausea and vomiting.  All other systems reviewed and are negative.    Physical Exam Updated Vital Signs BP 116/75   Pulse 70   Temp 98.6 F (37 C) (Oral)   Resp 18   Ht 5\' 5"  (1.651 m)   Wt 150 lb (68 kg)   LMP 11/28/2016   SpO2 100%   BMI 24.96 kg/m   Physical Exam  Constitutional: She is oriented to person, place, and time. She appears well-developed and well-nourished.  HENT:  Head: Normocephalic and atraumatic.  Right Ear: External ear normal.  Left Ear: External ear normal.  Nose: Nose normal.  Mouth/Throat: Mucous membranes are dry.  Eyes: Conjunctivae and EOM are normal. Pupils are equal, round, and reactive to light.  Neck: Normal range of motion. Neck supple.  Cardiovascular: Normal rate, regular rhythm, normal heart sounds and intact distal pulses.   Pulmonary/Chest: Effort normal and breath sounds normal.  Abdominal: Soft. There is generalized tenderness.  Musculoskeletal: Normal range of motion.  Neurological: She is alert and oriented to person, place, and time.  Skin: Skin is warm. Capillary refill takes less than 2 seconds.  Psychiatric: She has a normal mood and affect. Her behavior is normal. Judgment and thought content normal.  Nursing note and vitals reviewed.    ED Treatments / Results  Labs (all labs ordered are listed, but only abnormal results are displayed) Labs Reviewed  BASIC METABOLIC PANEL - Abnormal; Notable for the following:       Result Value   Potassium 2.9 (*)    Chloride 92 (*)    CO2 35 (*)    Glucose, Bld 104 (*)    BUN 22 (*)    All other components within normal limits  CBC - Abnormal; Notable for the following:    Platelets 426 (*)    All other components within normal limits  RAPID URINE DRUG SCREEN, HOSP PERFORMED - Abnormal; Notable for the following:    Tetrahydrocannabinol  POSITIVE (*)    All other components within normal limits  URINALYSIS, ROUTINE W REFLEX MICROSCOPIC - Abnormal; Notable for the following:    pH 9.0 (*)    Protein, ur 100 (*)    Leukocytes, UA TRACE (*)    Squamous Epithelial / LPF 0-5 (*)    All other components within normal limits  PREGNANCY, URINE  I-STAT TROPOININ, ED    EKG  EKG Interpretation  Date/Time:  Thursday December 08 2016 11:56:33 EST Ventricular Rate:  93 PR Interval:    QRS Duration: 91 QT Interval:  369 QTC Calculation: 459 R Axis:   40  Text Interpretation:  Sinus rhythm Confirmed by [facility] MD, Natahlia Hoggard ([id number]) on 12/08/2016 12:11:06 PM       Radiology Ct Abdomen Pelvis Wo Contrast  Result Date: 12/08/2016 CLINICAL DATA:  31 y/o F; mid to lower abdominal pain with nausea, vomiting, and diarrhea. EXAM: CT ABDOMEN AND PELVIS WITHOUT CONTRAST TECHNIQUE: Multidetector CT imaging of the abdomen and pelvis was performed following the standard protocol without IV contrast. COMPARISON:  None. FINDINGS: Lower chest: No acute abnormality. Hepatobiliary: No focal liver abnormality is seen. Status post cholecystectomy. No biliary dilatation. Pancreas: Unremarkable. No pancreatic ductal dilatation or surrounding inflammatory changes. Spleen: Normal in size without focal abnormality. Adrenals/Urinary Tract: Adrenal glands are unremarkable. Kidneys are normal, without renal calculi, focal lesion, or hydronephrosis. Bladder is unremarkable. Stomach/Bowel: Stomach is within normal limits. Appendix appears normal. No evidence of bowel wall thickening, distention, or inflammatory changes. Vascular/Lymphatic: No significant vascular findings are present. No enlarged abdominal or pelvic lymph nodes. Reproductive: Uterus and bilateral adnexa are unremarkable. Other: No abdominal wall hernia or abnormality. No abdominopelvic ascites. Musculoskeletal: No acute or significant osseous findings. IMPRESSION: 1. No acute process identified as  explanation for abdominal pain. 2. Cholecystectomy. Otherwise unremarkable CT of abdomen and pelvis without contrast for age. Electronically Signed   By: Mitzi HansenLance  Furusawa-Stratton M.D.   On: 12/08/2016 14:33   Dg Chest 2 View  Result Date: 12/08/2016 CLINICAL DATA:  Chest pain, nausea and vomiting EXAM: CHEST  2 VIEW COMPARISON:  Chest x-ray of 12/03/2016 FINDINGS: No active infiltrate or effusion is seen. Mediastinal and hilar contours are unremarkable. The heart is within normal limits in size. No acute bony abnormality is seen. IMPRESSION: No active cardiopulmonary disease. Electronically Signed   By: Dwyane DeePaul  Barry M.D.   On: 12/08/2016 12:49    Procedures Procedures (including critical care time)  Medications Ordered in ED Medications  potassium chloride SA (K-DUR,KLOR-CON) CR tablet 40 mEq (not administered)  sodium chloride 0.9 % bolus 1,000 mL (0 mLs Intravenous Stopped 12/08/16 1459)  promethazine (PHENERGAN) injection 25 mg (25 mg Intravenous Given 12/08/16 1303)     Initial Impression / Assessment and Plan / ED Course  I have reviewed the triage vital signs and the nursing notes.  Pertinent labs & imaging results that were available during my care of the patient were reviewed by me and considered in my medical decision making (see chart for details).  Pt is unhappy with discharge.  She wants to be admitted.  Her renal insufficiency and potassium is improved from a few days ago.  She has no ketones in her urine.  No reason for admission.   Pt d/w case management.  They will give pt a coupon for $3.00.  They will help pt with a pcp appt.   Final Clinical Impressions(s) / ED Diagnoses   Final diagnoses:  Non-intractable vomiting with nausea, unspecified vomiting type  Hypokalemia    New Prescriptions Current Discharge Medication List       Jacalyn LefevreJulie Avon Mergenthaler, MD 12/08/16 [phone]

## 2016-12-08 NOTE — ED Notes (Signed)
Nurse is in the room collecting labs 

## 2016-12-08 NOTE — ED Triage Notes (Signed)
Pt c/o CP.  Pt shaking and crying.  [facility] K was 2.4 but was told "they didn't have any potassium and they discharged her".  Pt states that she was given something for nausea and pain.  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  Pt c/o CP and vomiting.  Pt states that she is throwing up clots of blood.  Pt 100% on RA but requesting 02 via Barry for comfort.  2L placed on pt.

## 2016-12-08 NOTE — Progress Notes (Addendum)
   12/08/16 0000  CM Assessment  Expected Discharge Plan Home/Self Care  In-house Referral NA  Discharge Planning Services CM Consult;[facility] Program;Medication Assistance  [facility] NA  Choice offered to / list presented to  Patient  DME Arranged N/A  DME Agency NA  HH Arranged NA  HH Agency NA  Status of Service Completed, signed off  Discharge Disposition Home/Self Care   ED CM consult for Help with medications. Establish with pcp. ED CM spoke with Dr Particia NearingHaviland, EDP, for collaterol information. EDP states that the pt [facility] and [facility] but pt did not fill them. Pt was reported to not be very pleasant with interaction with EDP.   CM spoke with [place] but for "about two, almost three months" with no pcp. Pt noted with 4 [facility] ED visits during this 2-3 month time she has been in [place] CM discussed pt 4 ED visits with her and EDPs are not pcps   CM discussed and provided written information to assist pt with determining choice for uninsured accepting pcps, discussed the importance of pcp vs EDP services for f/u care, www.needymeds.org, www.goodrx.com, discounted pharmacies and other [place] resources such as [facility] , [facility], affordable care act, financial assistance, uninsured dental services, [place] med assist, DSS and  health department  Reviewed resources for [place] uninsured accepting pcps like [facility], [facility] medicine at [address], [facility], [facility], local urgent care centers, [facility], [facility], general medical clinics, family services of the [facility], [facility] urgent care plus others, medication resources, [facility] out patient pharmacies and housing Pt pleasant during CM interaction with her Pt voiced understanding and appreciation of resources provided   Provided  [facility] contact information and encouraged her to contact them closer to 3 months Pt stated she would Pt states she has money but not a lot of money  CM reviewed EPIC notes and chart review information CM spoke with the pt about [facility] Procare program ($3 co pay for each Rx through [employer] program, does not include refills, 7 day expiration of Procare letter and choice of pharmacies) Discussed with pt that prescriptions she used prior to the 12/08/16 can not be used Discussed that this is not a program accessible on a repeated basis Pt agreed to receive assistance from program    Pt is eligible for [facility] program (unable to find pt listed in ProcareI per cardholder name inquiry)  Procare information entered. Procare letter completed and provided to pt.   CM discussed available medical care overflow in [facility] sickle cell clinic with appts in last week of February 2018 available. CM offered to assist with scheduling her an appt but pt stated she would find an uninsured provider to go to her self and wanted to contact [facility] for assistance in a few weeks closer to her 3 month [place] residency for assistance with discounted pcp plus services

## 2017-01-02 ENCOUNTER — Ambulatory Visit: Payer: Self-pay

## 2017-01-06 ENCOUNTER — Ambulatory Visit: Payer: Self-pay

## 2017-02-17 ENCOUNTER — Encounter (HOSPITAL_COMMUNITY): Payer: Self-pay | Admitting: Emergency Medicine

## 2017-02-17 ENCOUNTER — Ambulatory Visit (HOSPITAL_COMMUNITY)
Admission: EM | Admit: 2017-02-17 | Discharge: 2017-02-17 | Disposition: A | Discharge status: Home / Self Care | Payer: Self-pay | Attending: Internal Medicine | Admitting: Internal Medicine

## 2017-02-17 ENCOUNTER — Ambulatory Visit (INDEPENDENT_AMBULATORY_CARE_PROVIDER_SITE_OTHER): Payer: Self-pay

## 2017-02-17 DIAGNOSIS — S6391XA Sprain of unspecified part of right wrist and hand, initial encounter: Secondary | ICD-10-CM

## 2017-02-17 DIAGNOSIS — S63501A Unspecified sprain of right wrist, initial encounter: Secondary | ICD-10-CM

## 2017-02-17 DIAGNOSIS — M79641 Pain in right hand: Secondary | ICD-10-CM

## 2017-02-17 DIAGNOSIS — M25531 Pain in right wrist: Secondary | ICD-10-CM

## 2017-02-17 MED ORDER — IBUPROFEN 800 MG PO TABS: 800.0000 mg | ORAL_TABLET | Freq: Three times a day (TID) | ORAL | 0 refills | Status: DC

## 2017-02-17 MED ORDER — KETOROLAC TROMETHAMINE 60 MG/2ML IM SOLN
60.0000 mg | Freq: Once | INTRAMUSCULAR | Status: AC
Start: 2017-02-17 — End: 2017-02-17
  Administered 2017-02-17: 60 mg via INTRAMUSCULAR

## 2017-02-17 MED ORDER — KETOROLAC TROMETHAMINE 60 MG/2ML IM SOLN
INTRAMUSCULAR | Status: AC
  Filled 2017-02-17: qty 2

## 2017-02-17 NOTE — Discharge Instructions (Signed)
Call Hand Orthopedic Surgeon Office for old hand fracture.  Wear splint for right wrist support.

## 2017-02-17 NOTE — ED Triage Notes (Signed)
Pt reports she fell backwards today while cutting grass around 1000  sts she fell backwards and braced herself inj her left wrist  sts she fractured her left wrist on 11/23/16  Sx today include: swelling and pain... Pain increases w/activity   A&O x4... NAD

## 2017-02-17 NOTE — ED Provider Notes (Signed)
CSN: [id number]     Arrival date & time 02/17/17  1023 History   None    Chief Complaint  Patient presents with  . Wrist Injury   (Consider location/radiation/quality/duration/timing/severity/associated sxs/prior Treatment) Patient c/o right wrist and hand pain after falling today on her right hand.  She has hx of right hand fracture.  She was referred to Hand but didn't go back in January.   The history is provided by the patient.  Wrist Injury  Injury: yes   Time since incident:  1 day Pain details:    Quality:  Aching   Radiates to:  Does not radiate   Severity:  No pain   Onset quality:  Sudden   Duration:  1 day   Timing:  Constant   Progression:  Worsening Handedness:  Right-handed Dislocation: no   Foreign body present:  No foreign bodies Tetanus status:  Unknown Prior injury to area:  Yes Relieved by:  Nothing Worsened by:  Nothing   Past Medical History:  Diagnosis Date  . ADD (attention deficit disorder)   . Asthma   . Bipolar disorder (HCC)   . Gastroparesis   . Potassium (K) deficiency   . Renal insufficiency    Past Surgical History:  Procedure Laterality Date  . TONSILLECTOMY     History reviewed. No pertinent family history. Social History  Substance Use Topics  . Smoking status: Former Games developer  . Smokeless tobacco: Never Used  . Alcohol use No   OB History    No data available     Review of Systems  Constitutional: Negative.   HENT: Negative.   Eyes: Negative.   Respiratory: Negative.   Cardiovascular: Negative.   Gastrointestinal: Negative.   Endocrine: Negative.   Genitourinary: Negative.   Musculoskeletal: Positive for arthralgias.  Skin: Negative.   Allergic/Immunologic: Negative.   Neurological: Negative.   Hematological: Negative.   Psychiatric/Behavioral: Negative.     Allergies  Coconut oil; Pineapple; and Latex  Home Medications   Prior to Admission medications   Medication Sig Start Date End Date Taking?  Authorizing Provider  acetaminophen (TYLENOL) 500 MG tablet Take 1,500 mg by mouth every 6 (six) hours as needed for headache (pain).   Yes Historical Provider, MD  albuterol (PROVENTIL HFA;VENTOLIN HFA) 108 (90 Base) MCG/ACT inhaler Inhale 2 puffs into the lungs every 6 (six) hours as needed for wheezing or shortness of breath. 11/23/16  Yes Tiffany Netta Cedars, PA-C  Asenapine Maleate (SAPHRIS) 10 MG SUBL Place 2 tablets (20 mg total) under the tongue 2 (two) times daily. 11/23/16  Yes Tiffany Netta Cedars, PA-C  divalproex (DEPAKOTE) 500 MG DR tablet Take 1 tablet (500 mg total) by mouth 2 (two) times daily. 11/23/16  Yes Tiffany Netta Cedars, PA-C  ondansetron (ZOFRAN) 4 MG/5ML solution Take 5 mLs (4 mg total) by mouth every 8 (eight) hours as needed for nausea or vomiting. 11/23/16  Yes Tiffany Netta Cedars, PA-C  pantoprazole (PROTONIX) 40 MG tablet Take 1 tablet (40 mg total) by mouth daily. 11/23/16  Yes Tiffany Netta Cedars, PA-C  potassium chloride SA (K-DUR,KLOR-CON) 20 MEQ tablet Take 1 tablet (20 mEq total) by mouth 2 (two) times daily. 12/08/16  Yes Jacalyn Lefevre, MD  venlafaxine (EFFEXOR) 75 MG tablet Take 1 tablet (75 mg total) by mouth 2 (two) times daily with a meal. 11/23/16  Yes Tiffany Netta Cedars, PA-C  HYDROcodone-acetaminophen (NORCO/VICODIN) 5-325 MG tablet Take 1 tablet by mouth every 4 (four) hours as needed. Patient taking differently:  Take 1 tablet by mouth every 4 (four) hours as needed for moderate pain or severe pain.  12/04/16   Jacalyn Lefevre, MD  ibuprofen (ADVIL,MOTRIN) 800 MG tablet Take 1 tablet (800 mg total) by mouth 3 (three) times daily. 02/17/17   Deatra Canter, FNP  lisdexamfetamine (VYVANSE) 20 MG capsule Take 1 capsule (20 mg total) by mouth daily. Patient not taking: Reported on 12/04/2016 11/23/16   Vivianne Master, PA-C  medroxyPROGESTERone (DEPO-PROVERA) 150 MG/ML injection Inject 150 mg into the muscle every 3 (three) months.    Historical Provider, MD  oxyCODONE-acetaminophen (ROXICET) 5-325  MG tablet Take 1 tablet by mouth every 8 (eight) hours as needed for severe pain. Patient not taking: Reported on 12/08/2016 11/23/16   Vivianne Master, PA-C  promethazine (PHENERGAN) 25 MG tablet Take 1 tablet (25 mg total) by mouth every 6 (six) hours as needed for nausea or vomiting. 12/08/16   Jacalyn Lefevre, MD  traZODone (DESYREL) 100 MG tablet Take 0.5 tablets (50 mg total) by mouth at bedtime as needed for sleep. Patient not taking: Reported on 12/04/2016 11/23/16   Vivianne Master, PA-C   Meds Ordered and Administered this Visit   Medications  ketorolac (TORADOL) injection 60 mg (not administered)    BP 117/74 (BP Location: Left Arm)   Pulse 70   Temp 98.4 F (36.9 C) (Oral)   Resp 20   LMP 02/17/2017   SpO2 100%  No data found.   Physical Exam  Constitutional: She appears well-developed and well-nourished.  HENT:  Head: Normocephalic and atraumatic.  Eyes: Conjunctivae and EOM are normal. Pupils are equal, round, and reactive to light.  Neck: Normal range of motion. Neck supple.  Cardiovascular: Normal rate, regular rhythm and normal heart sounds.   Pulmonary/Chest: Effort normal and breath sounds normal.  Musculoskeletal: She exhibits tenderness.  Right wrist tenderness w/o swelling or deformity.  Nursing note and vitals reviewed.   Urgent Care Course     Procedures (including critical care time)  Labs Review Labs Reviewed - No data to display  Imaging Review Dg Wrist Complete Right  Result Date: 02/17/2017 CLINICAL DATA:  Fall, right wrist pain, history of fracture EXAM: RIGHT WRIST - COMPLETE 3+ VIEW COMPARISON:  None. FINDINGS: No acute fracture or dislocation is seen. Old fracture deformity involving the lunate, with mild associated compression of the proximal carpal row. Mild degenerative changes of the radiocarpal articulation. Visualized soft tissues are within normal limits. IMPRESSION: No acute fracture or dislocation is seen. Old fracture deformity involving  the lunate. Electronically Signed   By: Charline Bills M.D.   On: 02/17/2017 11:04     Visual Acuity Review  Right Eye Distance:   Left Eye Distance:   Bilateral Distance:    Right Eye Near:   Left Eye Near:    Bilateral Near:         MDM   1. Right wrist pain   2. Sprain of right wrist, initial encounter   3. Right hand pain    Motrin 800 mg one po tid #30 Wrist Splint Toradol  IM Referral to Hand for right hand fracture.      Deatra Canter, FNP 02/17/17 1128

## 2017-02-22 ENCOUNTER — Ambulatory Visit: Payer: Self-pay | Attending: Internal Medicine

## 2017-03-06 ENCOUNTER — Encounter (HOSPITAL_COMMUNITY): Payer: Self-pay

## 2017-03-06 ENCOUNTER — Emergency Department (HOSPITAL_COMMUNITY): Payer: Self-pay

## 2017-03-06 ENCOUNTER — Emergency Department (HOSPITAL_COMMUNITY)
Admission: EM | Admit: 2017-03-06 | Discharge: 2017-03-06 | Discharge status: Against Medical Advice | Payer: Self-pay | Attending: Emergency Medicine | Admitting: Emergency Medicine

## 2017-03-06 DIAGNOSIS — F909 Attention-deficit hyperactivity disorder, unspecified type: Secondary | ICD-10-CM | POA: Insufficient documentation

## 2017-03-06 DIAGNOSIS — Z87891 Personal history of nicotine dependence: Secondary | ICD-10-CM | POA: Insufficient documentation

## 2017-03-06 DIAGNOSIS — N179 Acute kidney failure, unspecified: Secondary | ICD-10-CM | POA: Insufficient documentation

## 2017-03-06 DIAGNOSIS — Z79899 Other long term (current) drug therapy: Secondary | ICD-10-CM | POA: Insufficient documentation

## 2017-03-06 DIAGNOSIS — R112 Nausea with vomiting, unspecified: Secondary | ICD-10-CM

## 2017-03-06 DIAGNOSIS — Z91040 Latex allergy status: Secondary | ICD-10-CM | POA: Insufficient documentation

## 2017-03-06 DIAGNOSIS — J45909 Unspecified asthma, uncomplicated: Secondary | ICD-10-CM | POA: Insufficient documentation

## 2017-03-06 DIAGNOSIS — E876 Hypokalemia: Secondary | ICD-10-CM | POA: Insufficient documentation

## 2017-03-06 LAB — URINALYSIS, ROUTINE W REFLEX MICROSCOPIC
BILIRUBIN URINE: NEGATIVE
Bacteria, UA: NONE SEEN
GLUCOSE, UA: NEGATIVE mg/dL
HGB URINE DIPSTICK: NEGATIVE
Ketones, ur: NEGATIVE mg/dL
LEUKOCYTES UA: NEGATIVE
NITRITE: NEGATIVE
Protein, ur: 100 mg/dL — AB
SPECIFIC GRAVITY, URINE: 1.021 (ref 1.005–1.030)
pH: 9 — ABNORMAL HIGH (ref 5.0–8.0)

## 2017-03-06 LAB — COMPREHENSIVE METABOLIC PANEL
ALK PHOS: 52 U/L (ref 38–126)
ALT: 8 U/L — AB (ref 14–54)
AST: 19 U/L (ref 15–41)
Albumin: 3.9 g/dL (ref 3.5–5.0)
Anion gap: 10 (ref 5–15)
BILIRUBIN TOTAL: 0.6 mg/dL (ref 0.3–1.2)
BUN: 20 mg/dL (ref 6–20)
CO2: 36 mmol/L — ABNORMAL HIGH (ref 22–32)
CREATININE: 1.27 mg/dL — AB (ref 0.44–1.00)
Calcium: 9.3 mg/dL (ref 8.9–10.3)
Chloride: 89 mmol/L — ABNORMAL LOW (ref 101–111)
GFR calc Af Amer: >60 mL/min (ref >60)
GFR, EST NON AFRICAN AMERICAN: 56 mL/min — AB (ref >60)
Glucose, Bld: 111 mg/dL — ABNORMAL HIGH (ref 65–99)
POTASSIUM: 2.7 mmol/L — AB (ref 3.5–5.1)
Sodium: 135 mmol/L (ref 135–145)
TOTAL PROTEIN: 7.5 g/dL (ref 6.5–8.1)

## 2017-03-06 LAB — CBC
HCT: 34.9 % — ABNORMAL LOW (ref 36.0–46.0)
Hemoglobin: 11.9 g/dL — ABNORMAL LOW (ref 12.0–15.0)
MCH: 28.4 pg (ref 26.0–34.0)
MCHC: 34.1 g/dL (ref 30.0–36.0)
MCV: 83.3 fL (ref 78.0–100.0)
Platelets: 389 10*3/uL (ref 150–400)
RBC: 4.19 MIL/uL (ref 3.87–5.11)
RDW: 14 % (ref 11.5–15.5)
WBC: 9.2 10*3/uL (ref 4.0–10.5)

## 2017-03-06 LAB — RAPID URINE DRUG SCREEN, HOSP PERFORMED
Amphetamines: NOT DETECTED
BARBITURATES: NOT DETECTED
Benzodiazepines: NOT DETECTED
Cocaine: NOT DETECTED
Opiates: NOT DETECTED
TETRAHYDROCANNABINOL: POSITIVE — AB

## 2017-03-06 LAB — POC URINE PREG, ED: PREG TEST UR: NEGATIVE

## 2017-03-06 LAB — LIPASE, BLOOD: Lipase: 33 U/L (ref 11–51)

## 2017-03-06 LAB — MAGNESIUM: MAGNESIUM: 2.2 mg/dL (ref 1.7–2.4)

## 2017-03-06 MED ORDER — SODIUM CHLORIDE 0.9 % IV SOLN
30.0000 meq | Freq: Once | INTRAVENOUS | Status: DC
  Filled 2017-03-06 (×2): qty 15

## 2017-03-06 MED ORDER — SODIUM CHLORIDE 0.9 % IV BOLUS (SEPSIS)
1000.0000 mL | Freq: Once | INTRAVENOUS | Status: AC
  Administered 2017-03-06: 1000 mL via INTRAVENOUS

## 2017-03-06 MED ORDER — HALOPERIDOL LACTATE 5 MG/ML IJ SOLN
2.0000 mg | Freq: Once | INTRAMUSCULAR | Status: AC
  Administered 2017-03-06: 2 mg via INTRAVENOUS
  Filled 2017-03-06: qty 1

## 2017-03-06 MED ORDER — POTASSIUM CHLORIDE CRYS ER 20 MEQ PO TBCR
40.0000 meq | EXTENDED_RELEASE_TABLET | Freq: Once | ORAL | Status: AC
  Administered 2017-03-06: 40 meq via ORAL
  Filled 2017-03-06: qty 2

## 2017-03-06 NOTE — ED Notes (Signed)
Pt wanted to leave to pick up  Child. Pt informed of risk due to her critical low potassium. Pt states understanding of risk. Pt ambulatory and VS stable.

## 2017-03-06 NOTE — ED Notes (Signed)
Pt returned to room from xray.

## 2017-03-06 NOTE — ED Triage Notes (Signed)
Pt states "I have been vomiting on and off for 2 weeks and I have run out of meds." pt A&O x4 no destress

## 2017-03-06 NOTE — ED Provider Notes (Signed)
[facility]-EMERGENCY DEPT Provider Note   CSN: [id number] Arrival date & time: 03/06/17  0732     History   Chief Complaint Chief Complaint  Patient presents with  . Emesis    HPI Brandi Howard is a 31 y.o. female with multiple UR visit for nausea and vomiting. She has a history of potassium deficiency and renal insufficiency. He frequently has UDS positive for marijuana. The patient states that she is out of all of her medications and has been vomiting on and off for the past 2 weeks however over the past 24 hours has not been able to hold down any fluids. She states that her primary care doctor switched her from Phenergan to Zofran and that does not work for her. She also states that she is out of all of her pain medication. She uses Percocet. She states "I need my pain medicine because when I hurt, I hurt." She states that she has pain "all over my body." She denies specific abdominal pain. She denies bilious or bloody emesis. Something new with her symptoms is that she has been having watery diarrhea over the past 24 hours. She is also out of her potassium medications. She has a history of renal insufficiency and states that when her kidney function gets worse "to feel my kidneys hurting." She denies dysuria, hematuria, suprapubic pain. Vision also brought her left hand. She states that she applied some over-the-counter antibacterial ointment. She denies any break in the skin.  HPI  Past Medical History:  Diagnosis Date  . ADD (attention deficit disorder)   . Asthma   . Bipolar disorder (HCC)   . Gastroparesis   . Potassium (K) deficiency   . Renal insufficiency     There are no active problems to display for this patient.   Past Surgical History:  Procedure Laterality Date  . TONSILLECTOMY      OB History    No data available       Home Medications    Prior to Admission medications   Medication Sig Start Date End Date Taking? Authorizing Provider  acetaminophen  (TYLENOL) 500 MG tablet Take 1,500 mg by mouth every 6 (six) hours as needed for headache (pain).    Historical Provider, MD  albuterol (PROVENTIL HFA;VENTOLIN HFA) 108 (90 Base) MCG/ACT inhaler Inhale 2 puffs into the lungs every 6 (six) hours as needed for wheezing or shortness of breath. 11/23/16   Tiffany Netta Cedars, PA-C  Asenapine Maleate (SAPHRIS) 10 MG SUBL Place 2 tablets (20 mg total) under the tongue 2 (two) times daily. 11/23/16   Vivianne Master, PA-C  divalproex (DEPAKOTE) 500 MG DR tablet Take 1 tablet (500 mg total) by mouth 2 (two) times daily. 11/23/16   Vivianne Master, PA-C  HYDROcodone-acetaminophen (NORCO/VICODIN) 5-325 MG tablet Take 1 tablet by mouth every 4 (four) hours as needed. Patient taking differently: Take 1 tablet by mouth every 4 (four) hours as needed for moderate pain or severe pain.  12/04/16   Jacalyn Lefevre, MD  ibuprofen (ADVIL,MOTRIN) 800 MG tablet Take 1 tablet (800 mg total) by mouth 3 (three) times daily. 02/17/17   Deatra Canter, FNP  lisdexamfetamine (VYVANSE) 20 MG capsule Take 1 capsule (20 mg total) by mouth daily. Patient not taking: Reported on 12/04/2016 11/23/16   Vivianne Master, PA-C  medroxyPROGESTERone (DEPO-PROVERA) 150 MG/ML injection Inject 150 mg into the muscle every 3 (three) months.    Historical Provider, MD  ondansetron (ZOFRAN) 4 MG/5ML solution Take 5  mLs (4 mg total) by mouth every 8 (eight) hours as needed for nausea or vomiting. 11/23/16   Tiffany Netta Cedars, PA-C  oxyCODONE-acetaminophen (ROXICET) 5-325 MG tablet Take 1 tablet by mouth every 8 (eight) hours as needed for severe pain. Patient not taking: Reported on 12/08/2016 11/23/16   Vivianne Master, PA-C  pantoprazole (PROTONIX) 40 MG tablet Take 1 tablet (40 mg total) by mouth daily. 11/23/16   Tiffany Netta Cedars, PA-C  potassium chloride SA (K-DUR,KLOR-CON) 20 MEQ tablet Take 1 tablet (20 mEq total) by mouth 2 (two) times daily. 12/08/16   Jacalyn Lefevre, MD  promethazine (PHENERGAN) 25 MG tablet  Take 1 tablet (25 mg total) by mouth every 6 (six) hours as needed for nausea or vomiting. 12/08/16   Jacalyn Lefevre, MD  traZODone (DESYREL) 100 MG tablet Take 0.5 tablets (50 mg total) by mouth at bedtime as needed for sleep. Patient not taking: Reported on 12/04/2016 11/23/16   Vivianne Master, PA-C  venlafaxine [facility]) 75 MG tablet Take 1 tablet (75 mg total) by mouth 2 (two) times daily with a meal. 11/23/16   Vivianne Master, PA-C    Family History History reviewed. No pertinent family history.  Social History Social History  Substance Use Topics  . Smoking status: Former Games developer  . Smokeless tobacco: Never Used  . Alcohol use No     Allergies   Coconut oil; Pineapple; and Latex   Review of Systems Review of Systems   Ten systems reviewed and are negative for acute change, except as noted in the HPI.   Physical Exam Updated Vital Signs BP (!) 95/58 (BP Location: Left Arm)   Pulse 68   Temp 98.3 F (36.8 C) (Oral)   Resp 18   Ht  (1.651 m)   Wt 64.4 kg   LMP 02/17/2017   SpO2 98%   BMI 23.63 kg/m   Physical Exam  Constitutional: She is oriented to person, place, and time. She appears well-developed and well-nourished. No distress.  HENT:  Head: Normocephalic and atraumatic.  Moist oral mucosa  Eyes: Conjunctivae and EOM are normal. Pupils are equal, round, and reactive to light. No scleral icterus.  Neck: Normal range of motion.  Cardiovascular: Normal rate, regular rhythm and normal heart sounds.  Exam reveals no gallop and no friction rub.   No murmur heard. Pulmonary/Chest: Effort normal and breath sounds normal. No respiratory distress.  Abdominal: Soft. She exhibits no distension and no mass. There is no tenderness. There is no guarding.  Quiet bowel sounds  Neurological: She is alert and oriented to person, place, and time.  Skin: Skin is warm and dry. She is not diaphoretic.  Dime sized unbroken blister under the corneum lucidum of the R hand. No  signs of infection  Psychiatric: Her speech is rapid and/or pressured.  Multiple old and new superficial self-inflicted lacerations to the right  forearm  Nursing note and vitals reviewed.    ED Treatments / Results  Labs (all labs ordered are listed, but only abnormal results are displayed) Labs Reviewed  COMPREHENSIVE METABOLIC PANEL - Abnormal; Notable for the following:       Result Value   Potassium 2.7 (*)    Chloride 89 (*)    CO2 36 (*)    Glucose, Bld 111 (*)    Creatinine, Ser 1.27 (*)    ALT 8 (*)    GFR calc non Af Amer 56 (*)    All other components within normal  limits  CBC - Abnormal; Notable for the following:    Hemoglobin 11.9 (*)    HCT 34.9 (*)    All other components within normal limits  URINALYSIS, ROUTINE W REFLEX MICROSCOPIC - Abnormal; Notable for the following:    pH 9.0 (*)    Protein, ur 100 (*)    Squamous Epithelial / LPF 0-5 (*)    All other components within normal limits  RAPID URINE DRUG SCREEN, HOSP PERFORMED - Abnormal; Notable for the following:    Tetrahydrocannabinol POSITIVE (*)    All other components within normal limits  LIPASE, BLOOD  MAGNESIUM  POC URINE PREG, ED    EKG  EKG Interpretation None       Radiology No results found.  Procedures Procedures (including critical care time)  Medications Ordered in ED Medications - No data to display   Initial Impression / Assessment and Plan / ED Course  I have reviewed the triage vital signs and the nursing notes.  Pertinent labs & imaging results that were available during my care of the patient were reviewed by me and considered in my medical decision making (see chart for details).     Shin with some renal insufficiency, proteinuria, critical lead level potassium. The patient refused IV runs of potassium and told the nurses she needs to leave emergently today her daughter. She was given 1 dose to 40 MG Q of potassium however left prior to my knowledge AGAINST  MEDICAL ADVICE. I was unable to discuss the severity of her symptoms however the patient did tell the nursing staff that she has had potassiums this low before and that she plans on returning to the emergency department or following with her pcp when she picks her child up.  He said no active vomiting in the emergency department and her acute abdominal film shows a mild stool burden without evidence of small bowel obstruction or ileus.  Final Clinical Impressions(s) / ED Diagnoses   Final diagnoses:  Nausea and vomiting, intractability of vomiting not specified, unspecified vomiting type  Hypokalemia  Acute kidney injury [facility])    New Prescriptions New Prescriptions   No medications on file     Arthor Captain, PA-C 03/06/17 1650    Shaune Pollack, MD 03/08/17 [phone]

## 2017-03-16 ENCOUNTER — Ambulatory Visit (HOSPITAL_COMMUNITY)
Admission: EM | Admit: 2017-03-16 | Discharge: 2017-03-16 | Disposition: A | Discharge status: Home / Self Care | Payer: Self-pay | Attending: Family Medicine | Admitting: Family Medicine

## 2017-03-16 ENCOUNTER — Encounter (HOSPITAL_COMMUNITY): Payer: Self-pay | Admitting: Emergency Medicine

## 2017-03-16 DIAGNOSIS — R002 Palpitations: Secondary | ICD-10-CM

## 2017-03-16 DIAGNOSIS — R55 Syncope and collapse: Secondary | ICD-10-CM

## 2017-03-16 MED ORDER — POTASSIUM CHLORIDE CRYS ER 20 MEQ PO TBCR: 20.0000 meq | EXTENDED_RELEASE_TABLET | Freq: Two times a day (BID) | ORAL | 0 refills | Status: DC

## 2017-03-16 MED ORDER — DICYCLOMINE HCL 10 MG PO CAPS: 10.0000 mg | ORAL_CAPSULE | Freq: Three times a day (TID) | ORAL | 0 refills | Status: AC

## 2017-03-16 MED ORDER — ONDANSETRON 4 MG PO TBDP: 4.0000 mg | ORAL_TABLET | Freq: Three times a day (TID) | ORAL | 0 refills | Status: DC | PRN

## 2017-03-16 MED ORDER — VENLAFAXINE HCL 75 MG PO TABS: 75.0000 mg | ORAL_TABLET | Freq: Two times a day (BID) | ORAL | 1 refills | Status: DC

## 2017-03-16 MED ORDER — TRAZODONE HCL 100 MG PO TABS: 25.0000 mg | ORAL_TABLET | Freq: Every evening | ORAL | 1 refills | Status: AC | PRN

## 2017-03-16 MED ORDER — DIVALPROEX SODIUM 500 MG PO DR TAB: 500.0000 mg | DELAYED_RELEASE_TABLET | Freq: Two times a day (BID) | ORAL | 1 refills | Status: AC

## 2017-03-16 NOTE — ED Notes (Signed)
In depth conversation with patient regarding medication refills and her need to be seen in the ED for chest pain, seizures, and near syncopal episodes. Patient stated she understood she needs to go to the ED but is unable to at this time. I instructed patient that we would be discharging her as an AMA as our recommendation is further care in the ED. We did give patient prescriptions for her medications as myself and provider are afraid that she will not go to the ED. Patient pleasant upon discharge.

## 2017-03-16 NOTE — ED Triage Notes (Signed)
Pt reports palpitations atypically for the last two days.  She reports headache, chest pain, and SOB associated with the palpitations.

## 2017-03-16 NOTE — Discharge Instructions (Signed)
Please go straight to ED °

## 2017-03-16 NOTE — ED Provider Notes (Signed)
CSN: [id number]     Arrival date & time 03/16/17  1218 History   None    Chief Complaint  Patient presents with  . Palpitations    x2 days   (Consider location/radiation/quality/duration/timing/severity/associated sxs/prior Treatment) Patient c/o heart palpitations and episodes of blacking out.  Patient states she has these spells more often.  She states she is being told by her wife that she is having spells where she zones out and she is not aware of this.  She states her wife tells her she blanks out and is not talking.  She admits to passing out and falling out yesterday.  She c/o blacking out and losing conciousness yesterday.  She feels a rapid heart rate which is worsening.  She has been out of her potassium.  She states she is out of all her medicines and she was told to come here and get a refill on her medications.     The history is provided by the patient.  Loss of Consciousness  Episode history:  Multiple Most recent episode:  Today Duration:  1 minute Timing:  Intermittent Progression:  Worsening Chronicity:  New Witnessed: yes   Relieved by:  Nothing Worsened by:  Nothing Ineffective treatments:  None tried   Past Medical History:  Diagnosis Date  . ADD (attention deficit disorder)   . Asthma   . Bipolar disorder (HCC)   . Gastroparesis   . Potassium (K) deficiency   . Renal insufficiency    Past Surgical History:  Procedure Laterality Date  . TONSILLECTOMY     Family History  Problem Relation Age of Onset  . Heart failure Mother   . Stroke Mother    Social History  Substance Use Topics  . Smoking status: Former Games developermoker  . Smokeless tobacco: Never Used  . Alcohol use No   OB History    No data available     Review of Systems  Constitutional: Negative.   HENT: Negative.   Eyes: Negative.   Respiratory: Negative.   Cardiovascular: Positive for syncope.  Gastrointestinal: Negative.   Endocrine: Negative.   Musculoskeletal: Negative.    Allergic/Immunologic: Negative.   Hematological: Negative.   Psychiatric/Behavioral: Negative.     Allergies  Coconut oil; Pineapple; and Latex  Home Medications   Prior to Admission medications   Medication Sig Start Date End Date Taking? Authorizing Provider  acetaminophen (TYLENOL) 500 MG tablet Take 1,500 mg by mouth every 6 (six) hours as needed for headache (pain).   Yes Historical Provider, MD  albuterol (PROVENTIL HFA;VENTOLIN HFA) 108 (90 Base) MCG/ACT inhaler Inhale 2 puffs into the lungs every 6 (six) hours as needed for wheezing or shortness of breath. 11/23/16  Yes Tiffany Netta CedarsS Noel, PA-C  Asenapine Maleate (SAPHRIS) 10 MG SUBL Place 2 tablets (20 mg total) under the tongue 2 (two) times daily. 11/23/16  Yes Tiffany Netta CedarsS Noel, PA-C  dicyclomine (BENTYL) 10 MG capsule Take 10 mg by mouth 4 (four) times daily -  before meals and at bedtime.   Yes Historical Provider, MD  divalproex (DEPAKOTE) 500 MG DR tablet Take 1 tablet (500 mg total) by mouth 2 (two) times daily. 11/23/16  Yes Tiffany Netta CedarsS Noel, PA-C  HYDROcodone-acetaminophen (NORCO/VICODIN) 5-325 MG tablet Take 1 tablet by mouth every 4 (four) hours as needed. 12/04/16  Yes Jacalyn LefevreJulie Haviland, MD  ibuprofen (ADVIL,MOTRIN) 800 MG tablet Take 1 tablet (800 mg total) by mouth 3 (three) times daily. Patient taking differently: Take 800 mg by mouth every 8 (  eight) hours as needed for moderate pain.  02/17/17  Yes Deatra Canter, FNP  lisdexamfetamine (VYVANSE) 20 MG capsule Take 1 capsule (20 mg total) by mouth daily. 11/23/16  Yes Tiffany Netta Cedars, PA-C  medroxyPROGESTERone (DEPO-PROVERA) 150 MG/ML injection Inject 150 mg into the muscle every 3 (three) months.   Yes Historical Provider, MD  ondansetron (ZOFRAN) 4 MG/5ML solution Take 5 mLs (4 mg total) by mouth every 8 (eight) hours as needed for nausea or vomiting. 11/23/16  Yes Tiffany Netta Cedars, PA-C  oxyCODONE-acetaminophen (ROXICET) 5-325 MG tablet Take 1 tablet by mouth every 8 (eight) hours as  needed for severe pain. 11/23/16  Yes Tiffany Netta Cedars, PA-C  pantoprazole (PROTONIX) 40 MG tablet Take 1 tablet (40 mg total) by mouth daily. 11/23/16  Yes Tiffany Netta Cedars, PA-C  potassium chloride SA (K-DUR,KLOR-CON) 20 MEQ tablet Take 1 tablet (20 mEq total) by mouth 2 (two) times daily. 12/08/16  Yes Jacalyn Lefevre, MD  promethazine (PHENERGAN) 25 MG tablet Take 1 tablet (25 mg total) by mouth every 6 (six) hours as needed for nausea or vomiting. 12/08/16  Yes Jacalyn Lefevre, MD  traZODone (DESYREL) 100 MG tablet Take 0.5 tablets (50 mg total) by mouth at bedtime as needed for sleep. 11/23/16  Yes Tiffany Netta Cedars, PA-C  venlafaxine (EFFEXOR) 75 MG tablet Take 1 tablet (75 mg total) by mouth 2 (two) times daily with a meal. 11/23/16  Yes Vivianne Master, PA-C   Meds Ordered and Administered this Visit  Medications - No data to display  BP 114/63 (BP Location: Left Arm)   Pulse 63   Temp 98.1 F (36.7 C) (Oral)   Resp 16   LMP 03/06/2017 (Approximate)   SpO2 100%  No data found.   Physical Exam  Constitutional: She appears well-developed and well-nourished.  HENT:  Head: Normocephalic and atraumatic.  Right Ear: External ear normal.  Left Ear: External ear normal.  Mouth/Throat: Oropharynx is clear and moist.  Eyes: Conjunctivae are normal. Pupils are equal, round, and reactive to light.  Neck: Normal range of motion. Neck supple.  Cardiovascular: Normal rate, regular rhythm and normal heart sounds.   Pulmonary/Chest: Effort normal and breath sounds normal.  Abdominal: Soft. Bowel sounds are normal.  Nursing note and vitals reviewed.   Urgent Care Course     Procedures (including critical care time)  Labs Review Labs Reviewed - No data to display  Imaging Review No results found.   Visual Acuity Review  Right Eye Distance:   Left Eye Distance:   Bilateral Distance:    Right Eye Near:   Left Eye Near:    Bilateral Near:         MDM   1. Syncope, unspecified  syncope type   2. Heart palpitations   3. Bipolar disorder.  Pateint advised she should be transferred to ED and she refuses.  Patient states " I am here to get med refills so this does not happen anymore."  Explained at length that she is risking her life in leaving AMA and she states understanding.      Deatra Canter, FNP 03/16/17 1330    Deatra Canter, FNP 03/16/17 [phone]

## 2017-03-28 MED FILL — traZODone HCL 100 MG TABS: 100 | 60 days supply | Qty: 30 | Fill #0

## 2017-03-28 MED FILL — POTASSIUM CL ER 20 MEQ TAB: 20 | 15 days supply | Qty: 30 | Fill #0

## 2017-03-28 MED FILL — DICYCLOMINE 10 MG CAPSULE: 10 | 30 days supply | Qty: 120 | Fill #0

## 2017-03-28 MED FILL — DIVALPROEX SOD DR 500 MG TA: 500 | 30 days supply | Qty: 60 | Fill #0

## 2017-03-28 MED FILL — ONDANSETRON ODT 4 MG TABLET: 4 | 10 days supply | Qty: 30 | Fill #0

## 2017-03-28 MED FILL — VENLAFAXINE HCL 75 MG TAB: 75 | 30 days supply | Qty: 60 | Fill #0

## 2017-04-06 ENCOUNTER — Encounter: Payer: Self-pay | Admitting: Internal Medicine

## 2017-04-06 ENCOUNTER — Ambulatory Visit: Payer: Self-pay | Attending: Internal Medicine | Admitting: Internal Medicine

## 2017-04-06 VITALS — BP 104/72 | HR 70 | Temp 98.3°F | Resp 16 | Ht 65.0 in | Wt 142.4 lb

## 2017-04-06 DIAGNOSIS — R1115 Cyclical vomiting syndrome unrelated to migraine: Secondary | ICD-10-CM | POA: Insufficient documentation

## 2017-04-06 DIAGNOSIS — K219 Gastro-esophageal reflux disease without esophagitis: Secondary | ICD-10-CM | POA: Insufficient documentation

## 2017-04-06 DIAGNOSIS — E876 Hypokalemia: Secondary | ICD-10-CM | POA: Insufficient documentation

## 2017-04-06 DIAGNOSIS — Z79899 Other long term (current) drug therapy: Secondary | ICD-10-CM | POA: Insufficient documentation

## 2017-04-06 DIAGNOSIS — K029 Dental caries, unspecified: Secondary | ICD-10-CM | POA: Insufficient documentation

## 2017-04-06 DIAGNOSIS — N289 Disorder of kidney and ureter, unspecified: Secondary | ICD-10-CM

## 2017-04-06 DIAGNOSIS — M130 Polyarthritis, unspecified: Secondary | ICD-10-CM

## 2017-04-06 DIAGNOSIS — R1110 Vomiting, unspecified: Secondary | ICD-10-CM

## 2017-04-06 DIAGNOSIS — Z131 Encounter for screening for diabetes mellitus: Secondary | ICD-10-CM

## 2017-04-06 DIAGNOSIS — F988 Other specified behavioral and emotional disorders with onset usually occurring in childhood and adolescence: Secondary | ICD-10-CM

## 2017-04-06 DIAGNOSIS — R55 Syncope and collapse: Secondary | ICD-10-CM | POA: Insufficient documentation

## 2017-04-06 DIAGNOSIS — Z8659 Personal history of other mental and behavioral disorders: Secondary | ICD-10-CM

## 2017-04-06 LAB — POCT GLYCOSYLATED HEMOGLOBIN (HGB A1C): HEMOGLOBIN A1C: 5.6

## 2017-04-06 LAB — GLUCOSE, POCT (MANUAL RESULT ENTRY): POC GLUCOSE: 101 mg/dL — AB (ref 70–99)

## 2017-04-06 MED ORDER — ALBUTEROL SULFATE HFA 108 (90 BASE) MCG/ACT IN AERS: 2.0000 | INHALATION_SPRAY | Freq: Four times a day (QID) | RESPIRATORY_TRACT | 6 refills | Status: AC | PRN

## 2017-04-06 MED ORDER — PANTOPRAZOLE SODIUM 40 MG PO TBEC: 40.0000 mg | DELAYED_RELEASE_TABLET | Freq: Every day | ORAL | 1 refills | Status: AC

## 2017-04-06 MED ORDER — POTASSIUM CHLORIDE CRYS ER 20 MEQ PO TBCR: 20.0000 meq | EXTENDED_RELEASE_TABLET | Freq: Two times a day (BID) | ORAL | 2 refills | Status: DC

## 2017-04-06 MED ORDER — ONDANSETRON 4 MG PO TBDP: 4.0000 mg | ORAL_TABLET | Freq: Three times a day (TID) | ORAL | 2 refills | Status: AC | PRN

## 2017-04-06 MED ORDER — VENLAFAXINE HCL 75 MG PO TABS
75.0000 mg | ORAL_TABLET | Freq: Two times a day (BID) | ORAL | 3 refills | Status: AC
Start: 2017-04-06 — End: ?

## 2017-04-06 MED FILL — ?PANTOPRAZOLE SOD DR 40MG: 40 MG | 30 days supply | Qty: 30 | Fill #0

## 2017-04-06 MED FILL — !VENTOLIN HFA INHALER: 108 (90 BAS | 25 days supply | Qty: 18 | Fill #0

## 2017-04-06 NOTE — Progress Notes (Signed)
Patient ID: Brandi Howard, female    DOB: 29-Jun-1986  MRN: [id number]  CC: Establish Care; Hyperglycemia; Gastroesophageal Reflux; Medication Refill; and Rash (feet - bottom x 2-3 mths)   Subjective: Brandi Howard is a 31 y.o. female who presents for f/u visit. Her concerns today include:  Patient with history of bipolar disorder and ADD, renal insufficiency, hypokalemia, asthma.  Pt with recent UC visit 03/16/2017 with c/o syncope and palpitations and requesting RF on meds.  Pt advised to be seen in ER but left AMA.  Also seen in ER 4/23 requesting pain med; found to have K+ of 2.7.  Given oral potassium and left AMA.  Today she presents with several concerns: 1.  Dental referral - broken tooth and a cavity.  Does have [facility] card  2.  Request GI referral:  "nothing I eat stays down. For longer than 5 mins" - vomiting 5-7 mins after a meal.  Stomach in knots. + burning in stomach  -+pain with swallowing.  + sores in mth from persistant vomiting -GB removed but did not solve problem.  Then was told she has gastroparesis and IBS by physicians in [place] -History of frequent marijuana use but states she discontinued using about a month ago -wgh 386 lbs 1.5 yrs ago -endorses polyuria/dipsa. -no blood in stools or urine -no fever or chronic rash -"hurting all over" - knees, wrists, feet  and elbows.  Worse in raining weather.  Hurts to pick up frying pain.  Swelling in hands, feet and RT knee.  Positive joint stiffness for 30-45 mins in a.m.  "Some days all day."  GM paternal had RA Requesting RF on Phenergan and Oxycodone.  Has Zofran which she has not filled as yet.     3. Renal insuff and chronic hypokalemia x 1 yr.  May be due to constant vomiting. Never saw a nephrologist  4.  Hx of Bipolar and ADD.  Never seen psychiatrist here. Last psychiatrist seen was in [place] about 1 yr ago. -Request refill on Vyvanse.   -also on Effexor and Trazodone but out for a while due to  lack of finances. Plans to get filled today  Current Outpatient Prescriptions on File Prior to Visit  Medication Sig Dispense Refill  . acetaminophen (TYLENOL) 500 MG tablet Take 1,500 mg by mouth every 6 (six) hours as needed for headache (pain).    Marland Kitchen dicyclomine (BENTYL) 10 MG capsule Take 1 capsule (10 mg total) by mouth 4 (four) times daily -  before meals and at bedtime. 120 capsule 0  . divalproex (DEPAKOTE) 500 MG DR tablet Take 1 tablet (500 mg total) by mouth 2 (two) times daily. 60 tablet 1  . ibuprofen (ADVIL,MOTRIN) 800 MG tablet Take 1 tablet (800 mg total) by mouth 3 (three) times daily. (Patient taking differently: Take 800 mg by mouth every 8 (eight) hours as needed for moderate pain. ) 30 tablet 0  . lisdexamfetamine (VYVANSE) 20 MG capsule Take 1 capsule (20 mg total) by mouth daily. 30 capsule 0  . oxyCODONE-acetaminophen (ROXICET) 5-325 MG tablet Take 1 tablet by mouth every 8 (eight) hours as needed for severe pain. 30 tablet 0  . traZODone (DESYREL) 100 MG tablet Take 0.5 tablets (50 mg total) by mouth at bedtime as needed for sleep. 30 tablet 1  . HYDROcodone-acetaminophen (NORCO/VICODIN) 5-325 MG tablet Take 1 tablet by mouth every 4 (four) hours as needed. (Patient not taking: Reported on 04/06/2017) 10 tablet 0  . medroxyPROGESTERone (DEPO-PROVERA)  150 MG/ML injection Inject 150 mg into the muscle every 3 (three) months.    . ondansetron (ZOFRAN) 4 MG/5ML solution Take 5 mLs (4 mg total) by mouth every 8 (eight) hours as needed for nausea or vomiting. (Patient not taking: Reported on 04/06/2017) 50 mL 1   No current facility-administered medications on file prior to visit.     Allergies  Allergen Reactions  . Coconut Oil Anaphylaxis  . Saphris [Asenapine] Other (See Comments)    Caused Canker sores in mouth  . Pineapple Other (See Comments)    Blisters inside mouth  . Latex Rash    Social History   Social History  . Marital status: Single    Spouse name: N/A  .  Number of children: N/A  . Years of education: N/A   Occupational History  . Not on file.   Social History Main Topics  . Smoking status: Former Games developer  . Smokeless tobacco: Former Neurosurgeon  . Alcohol use No  . Drug use: No     Comment: quit April 2018  . Sexual activity: Yes    Partners: Female    Birth control/ protection: None   Other Topics Concern  . Not on file   Social History Narrative  . No narrative on file    Family History  Problem Relation Age of Onset  . Heart failure Mother   . Stroke Mother   . Cervical cancer Mother   . Hypertension Mother   . Hyperlipidemia Mother   . Hypertension Father   . Hyperlipidemia Father     Past Surgical History:  Procedure Laterality Date  . TONSILLECTOMY      ROS: Review of Systems  Constitutional: Negative for fever.       As stated above with noted weight change and persistent intermittent vomiting  Respiratory: Negative for shortness of breath.   Cardiovascular: Negative for chest pain.  Gastrointestinal: Positive for abdominal pain.       Endorses dysphagia and heart burn  Endocrine: Positive for cold intolerance, heat intolerance, polydipsia and polyuria.  Psychiatric/Behavioral: The patient is nervous/anxious and is hyperactive.     PHYSICAL EXAM: BP 104/72 (BP Location: Right Arm, Patient Position: Sitting, Cuff Size: Large)   Pulse 70   Temp 98.3 F (36.8 C) (Oral)   Resp 16   Ht 5\' 5"  (1.651 m)   Wt 142 lb 6.4 oz (64.6 kg)   LMP 09/18/2016 (Approximate)   SpO2 100%   BMI 23.70 kg/m   Physical Exam  General appearance - alert, well appearing, young female and in no distress Mental status - alert, oriented to person, place, and time Mouth - mucous membranes moist, pharynx normal without lesions.  No oral ulcers.  RT lower 3rd molar is pratially broken Neck - supple, no significant adenopathy Chest - clear to auscultation, no wheezes, rales or rhonchi, symmetric air entry Heart - normal rate,  regular rhythm, normal S1, S2, no murmurs, rubs, clicks or gallops Abdomen - soft, normal bowel sounds, endorses mild diffuse tenderness but seems okay when distracted. no guarding or rebound  Musculoskeletal - mild joint enlargement of the left knee but good range of motion and no point tenderness in this joint . Mild enlargement of the PIP joints of both middle finger.  No signs of active inflammation in the other fingers or the wrists.  Moderate discomfort on palpation of an passive movement of both wrists.  No signs of active inflammation in this ankles or small bones of  the feet.  No point tenderness and good range of motion at both elbows  Extremities -no lower extremity edema    Results for orders placed or performed in visit on 04/06/17  POCT glucose (manual entry)  Result Value Ref Range   POC Glucose 101 (A) 70 - 99 mg/dl  POCT glycosylated hemoglobin (Hb A1C)  Result Value Ref Range   Hemoglobin A1C 5.6     Depression screen [facility] 04/06/2017  Decreased Interest 2  Down, Depressed, Hopeless 0  PHQ - 2 Score 2  Altered sleeping 3  Tired, decreased energy 2  Change in appetite 2  Feeling bad or failure about yourself  1  Trouble concentrating 2  Moving slowly or fidgety/restless 2  Suicidal thoughts 0  PHQ-9 Score 14   GAD 7 : Generalized Anxiety Score 04/06/2017 11/23/2016  Nervous, Anxious, on Edge 1 3  Control/stop worrying 3 0  Worry too much - different things 1 3  Trouble relaxing 2 3  Restless 3 2  Easily annoyed or irritable 3 2  Afraid - awful might happen 0 0  Total GAD 7 Score 13 13   Lab Results  Component Value Date   WBC 9.2 03/06/2017   HGB 11.9 (L) 03/06/2017   HCT 34.9 (L) 03/06/2017   MCV 83.3 03/06/2017   PLT 389 03/06/2017     Chemistry      Component Value Date/Time   NA 135 03/06/2017 0754   K 2.7 (LL) 03/06/2017 0754   CL 89 (L) 03/06/2017 0754   CO2 36 (H) 03/06/2017 0754   BUN 20 03/06/2017 0754   CREATININE 1.27 (H) 03/06/2017  0754      Component Value Date/Time   CALCIUM 9.3 03/06/2017 0754   ALKPHOS 52 03/06/2017 0754   AST 19 03/06/2017 0754   ALT 8 (L) 03/06/2017 0754   BILITOT 0.6 03/06/2017 0754      ASSESSMENT AND PLAN: 1. Persistent vomiting -Lipase on ER visit has been normal.  She may have hyperemesis associated with withdrawal of marijuana.  Other possibilities include eating disorder, related to mental illness or true underlying GI pathology. -Refill Protonix for the acid reflux and dysphagia -Refer to GI for further evaluation -I declined refill on oxycodone. Patient advised to use Tylenol when necessary. We will also get her back on Effexor or will consider change to Cymbalta - TSH - VITAMIN D 25 Hydroxy (Vit-D Deficiency, Fractures) - Ambulatory referral to Gastroenterology - ondansetron (ZOFRAN ODT) 4 MG disintegrating tablet; Take 1 tablet (4 mg total) by mouth every 8 (eight) hours as needed for nausea or vomiting.  Dispense: 30 tablet; Refill: 2 - potassium chloride SA (K-DUR,KLOR-CON) 20 MEQ tablet; Take 1 tablet (20 mEq total) by mouth 2 (two) times daily.  Dispense: 60 tablet; Refill: 2 - pantoprazole (PROTONIX) 40 MG tablet; Take 1 tablet (40 mg total) by mouth daily.  Dispense: 30 tablet; Refill: 1  2. Polyarthritis of multiple sites -Basic rheumatologic workup with labs ordered below - Sedimentation Rate - CYCLIC CITRUL PEPTIDE ANTIBODY, IGG/IGA - ANA  3. Dental cavities -Dental referral  4. Renal insufficiency -Low potassium may be due to persistent intermittent vomiting. Refill potassium and check levels today - Basic metabolic panel - Microalbumin / creatinine urine ratio - Ambulatory referral to Nephrology  5. Diabetes mellitus screening -Screening test is negative - POCT glucose (manual entry) - POCT glycosylated hemoglobin (Hb A1C)  6. Hx of bipolar disorder 7. Attention deficit disorder, unspecified hyperactivity presence - Ambulatory  referral to  Psychiatry -declined RF on Vyvanse.  Will need to be established with behavioral health -Refill Effexor and trazodone  Patient was given the opportunity to ask questions.  Patient verbalized understanding of the plan and was able to repeat key elements of the plan.   Orders Placed This Encounter  Procedures  . Basic metabolic panel  . Microalbumin / creatinine urine ratio  . TSH  . VITAMIN D 25 Hydroxy (Vit-D Deficiency, Fractures)  . Sedimentation Rate  . CYCLIC CITRUL PEPTIDE ANTIBODY, IGG/IGA  . ANA  . Ambulatory referral to Gastroenterology  . Ambulatory referral to Nephrology  . Ambulatory referral to Psychiatry  . Ambulatory referral to Dentistry  . POCT glucose (manual entry)  . POCT glycosylated hemoglobin (Hb A1C)     Requested Prescriptions   Signed Prescriptions Disp Refills  . albuterol (PROVENTIL HFA;VENTOLIN HFA) 108 (90 Base) MCG/ACT inhaler 1 Inhaler 6    Sig: Inhale 2 puffs into the lungs every 6 (six) hours as needed for wheezing or shortness of breath.  . ondansetron (ZOFRAN ODT) 4 MG disintegrating tablet 30 tablet 2    Sig: Take 1 tablet (4 mg total) by mouth every 8 (eight) hours as needed for nausea or vomiting.  . potassium chloride SA (K-DUR,KLOR-CON) 20 MEQ tablet 60 tablet 2    Sig: Take 1 tablet (20 mEq total) by mouth 2 (two) times daily.  . pantoprazole (PROTONIX) 40 MG tablet 30 tablet 1    Sig: Take 1 tablet (40 mg total) by mouth daily.  Marland Kitchen venlafaxine (EFFEXOR) 75 MG tablet 60 tablet 3    Sig: Take 1 tablet (75 mg total) by mouth 2 (two) times daily with a meal.    Return in about 7 weeks (around 05/25/2017).  Jonah Blue, MD, FACP

## 2017-04-08 ENCOUNTER — Telehealth: Payer: Self-pay | Admitting: Internal Medicine

## 2017-04-08 LAB — BASIC METABOLIC PANEL
BUN/Creatinine Ratio: 17 (ref 9–23)
BUN: 17 mg/dL (ref 6–20)
CALCIUM: 9.7 mg/dL (ref 8.7–10.2)
CHLORIDE: 90 mmol/L — AB (ref 96–106)
CO2: 33 mmol/L — ABNORMAL HIGH (ref 18–29)
CREATININE: 1.01 mg/dL — AB (ref 0.57–1.00)
GFR calc non Af Amer: 75 mL/min/{1.73_m2} (ref >59)
GFR, EST AFRICAN AMERICAN: 86 mL/min/{1.73_m2} (ref >59)
Glucose: 79 mg/dL (ref 65–99)
Potassium: 3.1 mmol/L — ABNORMAL LOW (ref 3.5–5.2)
Sodium: 139 mmol/L (ref 134–144)

## 2017-04-08 LAB — ANA: Anti Nuclear Antibody(ANA): NEGATIVE

## 2017-04-08 LAB — TSH: TSH: 0.691 u[IU]/mL (ref 0.450–4.500)

## 2017-04-08 LAB — CYCLIC CITRUL PEPTIDE ANTIBODY, IGG/IGA: CYCLIC CITRULLIN PEPTIDE AB: 6 U (ref 0–19)

## 2017-04-08 LAB — MICROALBUMIN / CREATININE URINE RATIO
Creatinine, Urine: 365.2 mg/dL
MICROALB/CREAT RATIO: 18.2 mg/g{creat} (ref 0.0–30.0)
Microalbumin, Urine: 66.6 ug/mL

## 2017-04-08 LAB — SEDIMENTATION RATE: Sed Rate: 6 mm/hr (ref 0–32)

## 2017-04-08 LAB — VITAMIN D 25 HYDROXY (VIT D DEFICIENCY, FRACTURES): VIT D 25 HYDROXY: 21.6 ng/mL — AB (ref 30.0–100.0)

## 2017-04-08 NOTE — Telephone Encounter (Signed)
I tried calling pt twice to discuss lab results.  I got recording stating that the person I am trying to reach can not accept calls at this time.  Message sent to CMA to try to reach pt with lab results.

## 2017-04-08 NOTE — Telephone Encounter (Signed)
Addendum:  Correction - I will send pt her result via Mychart since she has an account.

## 2017-04-17 ENCOUNTER — Encounter: Payer: Self-pay | Admitting: Internal Medicine

## 2017-04-17 NOTE — Telephone Encounter (Signed)
Patient concern about lab results

## 2017-05-23 ENCOUNTER — Ambulatory Visit (HOSPITAL_COMMUNITY)
Admission: EM | Admit: 2017-05-23 | Discharge: 2017-05-23 | Disposition: A | Discharge status: Home / Self Care | Payer: Self-pay | Attending: Family Medicine | Admitting: Family Medicine

## 2017-05-23 ENCOUNTER — Telehealth: Payer: Self-pay | Admitting: Internal Medicine

## 2017-05-23 ENCOUNTER — Encounter (HOSPITAL_COMMUNITY): Payer: Self-pay | Admitting: Emergency Medicine

## 2017-05-23 DIAGNOSIS — M26609 Unspecified temporomandibular joint disorder, unspecified side: Secondary | ICD-10-CM

## 2017-05-23 MED ORDER — IBUPROFEN 800 MG PO TABS: 800.0000 mg | ORAL_TABLET | Freq: Three times a day (TID) | ORAL | 0 refills | Status: DC

## 2017-05-23 NOTE — ED Triage Notes (Signed)
Patient has multiple teeth issues, reports one tooth is broken.  Patient had pain a month ago, then went away, pain returned 2 weeks ago.  Patient has [facility] card and is waiting for a referral.

## 2017-05-23 NOTE — Telephone Encounter (Signed)
Pt is calling stating that she suspects that she has an infection in her teeth and is asking for her dental referral to be changed to an urgent referral. States that she is unable to rest due to the pain that she is experiencing. Please f/u.

## 2017-05-23 NOTE — ED Provider Notes (Signed)
CSN: [id number]     Arrival date & time 05/23/17  1747 History   First MD Initiated Contact with Patient 05/23/17 1903     Chief Complaint  Patient presents with  . Dental Pain   (Consider location/radiation/quality/duration/timing/severity/associated sxs/prior Treatment) The history is provided by the patient.  Dental Pain  Location:  Upper and lower Upper teeth location: All teeth. Lower teeth location: Teeth. Quality:  Localized, pulsating and pressure-like Severity:  Severe Onset quality:  Gradual Duration:  2 weeks Timing:  Constant Progression:  Worsening Chronicity:  Recurrent Context: poor dentition   Relieved by:  Nothing Worsened by:  Cold food/drink, hot food/drink, touching, jaw movement and pressure Ineffective treatments:  Acetaminophen, NSAIDs and topical anesthetic gel Associated symptoms: facial pain   Associated symptoms: no facial swelling, no fever, no headaches, no neck pain, no neck swelling, no oral bleeding and no oral lesions     Past Medical History:  Diagnosis Date  . ADD (attention deficit disorder)   . Asthma   . Bipolar disorder (HCC)   . Gastroparesis   . Potassium (K) deficiency   . Renal insufficiency    Past Surgical History:  Procedure Laterality Date  . TONSILLECTOMY     Family History  Problem Relation Age of Onset  . Heart failure Mother   . Stroke Mother   . Cervical cancer Mother   . Hypertension Mother   . Hyperlipidemia Mother   . Hypertension Father   . Hyperlipidemia Father    Social History  Substance Use Topics  . Smoking status: Former Games developer  . Smokeless tobacco: Former Neurosurgeon  . Alcohol use No   OB History    No data available     Review of Systems  Constitutional: Negative for fever.  HENT: Positive for dental problem. Negative for facial swelling and mouth sores.   Respiratory: Negative.   Cardiovascular: Negative.   Gastrointestinal: Negative.   Musculoskeletal: Negative for neck pain and neck  stiffness.  Skin: Negative.   Neurological: Negative for headaches.    Allergies  Coconut oil; Saphris [asenapine]; Pineapple; and Latex  Home Medications   Prior to Admission medications   Medication Sig Start Date End Date Taking? Authorizing Provider  divalproex (DEPAKOTE) 500 MG DR tablet Take 1 tablet (500 mg total) by mouth 2 (two) times daily. 03/16/17  Yes Deatra Canter, FNP  pantoprazole (PROTONIX) 40 MG tablet Take 1 tablet (40 mg total) by mouth daily. 04/06/17  Yes Marcine Matar, MD  potassium chloride SA (K-DUR,KLOR-CON) 20 MEQ tablet Take 1 tablet (20 mEq total) by mouth 2 (two) times daily. 04/06/17  Yes Marcine Matar, MD  venlafaxine (EFFEXOR) 75 MG tablet Take 1 tablet (75 mg total) by mouth 2 (two) times daily with a meal. 04/06/17  Yes Marcine Matar, MD  acetaminophen (TYLENOL) 500 MG tablet Take 1,500 mg by mouth every 6 (six) hours as needed for headache (pain).    [provider]  albuterol (PROVENTIL HFA;VENTOLIN HFA) 108 (90 Base) MCG/ACT inhaler Inhale 2 puffs into the lungs every 6 (six) hours as needed for wheezing or shortness of breath. 04/06/17   Marcine Matar, MD  dicyclomine (BENTYL) 10 MG capsule Take 1 capsule (10 mg total) by mouth 4 (four) times daily -  before meals and at bedtime. 03/16/17   Deatra Canter, FNP  HYDROcodone-acetaminophen (NORCO/VICODIN) 5-325 MG tablet Take 1 tablet by mouth every 4 (four) hours as needed. Patient not taking: Reported on 04/06/2017 12/04/16  Jacalyn LefevreHaviland, Julie, MD  ibuprofen (ADVIL,MOTRIN) 800 MG tablet Take 1 tablet (800 mg total) by mouth 3 (three) times daily. 05/23/17   Dorena BodoKennard, Kailin Leu, NP  lisdexamfetamine (VYVANSE) 20 MG capsule Take 1 capsule (20 mg total) by mouth daily. 11/23/16   Vivianne MasterNoel, Tiffany S, PA-C  medroxyPROGESTERone (DEPO-PROVERA) 150 MG/ML injection Inject 150 mg into the muscle every 3 (three) months.    [provider]  ondansetron (ZOFRAN ODT) 4 MG disintegrating tablet  Take 1 tablet (4 mg total) by mouth every 8 (eight) hours as needed for nausea or vomiting. 04/06/17   Marcine MatarJohnson, Deborah B, MD  ondansetron [facility](ZOFRAN) 4 MG/5ML solution Take 5 mLs (4 mg total) by mouth every 8 (eight) hours as needed for nausea or vomiting. Patient not taking: Reported on 04/06/2017 11/23/16   Vivianne MasterNoel, Tiffany S, PA-C  oxyCODONE-acetaminophen (ROXICET) 5-325 MG tablet Take 1 tablet by mouth every 8 (eight) hours as needed for severe pain. 11/23/16   Vivianne MasterNoel, Tiffany S, PA-C  traZODone (DESYREL) 100 MG tablet Take 0.5 tablets (50 mg total) by mouth at bedtime as needed for sleep. 03/16/17   Deatra Canterxford, William J, FNP   Meds Ordered and Administered this Visit  Medications - No data to display  BP 115/64 (BP Location: Right Arm)   Pulse 62   Temp 98.6 F (37 C) (Oral)   Resp 16   SpO2 100%  No data found.   Physical Exam  Constitutional: She is oriented to person, place, and time. She appears well-developed and well-nourished. No distress.  HENT:  Head: Normocephalic.  Right Ear: External ear normal.  Left Ear: External ear normal.  Mouth/Throat: Uvula is midline and oropharynx is clear and moist. No dental abscesses, uvula swelling or dental caries. No tonsillar exudate.  Neck: Normal range of motion.  Cardiovascular: Normal rate and regular rhythm.   Pulmonary/Chest: Effort normal and breath sounds normal.  Lymphadenopathy:    She has cervical adenopathy.  Neurological: She is alert and oriented to person, place, and time.  Skin: Skin is warm and dry. Capillary refill takes less than 2 seconds. She is not diaphoretic.  Nursing note and vitals reviewed.   Urgent Care Course     Procedures (including critical care time)  Labs Review Labs Reviewed - No data to display  Imaging Review No results found.   MDM   1. TMJ (temporomandibular joint syndrome)     No evidence of abscess or dental infection, pain noted to be global throughout the entirety of the mouth, pain with  clenching of the mouth, most likely TMJ rather than dental infection. Follow-up with dentistry.    Dorena BodoKennard, Arelly Whittenberg, NP 05/23/17 2133

## 2017-05-23 NOTE — Discharge Instructions (Signed)
Your signs and symptoms are consistent with TMJ, this is a chronic condition, you will need to be seen and evaluated by a dentist, otherwise your condition will worsen. Follow-up with dentistry as soon as possible.

## 2017-05-25 NOTE — Telephone Encounter (Signed)
Noted  Sent a new referral to [facility] with [facility] ph. # [phone] [address] [place] [id number] They will contact the patient to schedule an appointment

## 2017-05-29 ENCOUNTER — Ambulatory Visit: Payer: Self-pay | Attending: Internal Medicine | Admitting: Internal Medicine

## 2017-05-29 ENCOUNTER — Encounter: Payer: Self-pay | Admitting: Internal Medicine

## 2017-05-29 VITALS — BP 131/70 | HR 63 | Temp 97.8°F | Resp 16 | Wt 141.6 lb

## 2017-05-29 DIAGNOSIS — Z8249 Family history of ischemic heart disease and other diseases of the circulatory system: Secondary | ICD-10-CM | POA: Insufficient documentation

## 2017-05-29 DIAGNOSIS — Z8049 Family history of malignant neoplasm of other genital organs: Secondary | ICD-10-CM | POA: Insufficient documentation

## 2017-05-29 DIAGNOSIS — K219 Gastro-esophageal reflux disease without esophagitis: Secondary | ICD-10-CM | POA: Insufficient documentation

## 2017-05-29 DIAGNOSIS — R1115 Cyclical vomiting syndrome unrelated to migraine: Secondary | ICD-10-CM

## 2017-05-29 DIAGNOSIS — R6884 Jaw pain: Secondary | ICD-10-CM | POA: Insufficient documentation

## 2017-05-29 DIAGNOSIS — K029 Dental caries, unspecified: Secondary | ICD-10-CM | POA: Insufficient documentation

## 2017-05-29 DIAGNOSIS — Z888 Allergy status to other drugs, medicaments and biological substances status: Secondary | ICD-10-CM | POA: Insufficient documentation

## 2017-05-29 DIAGNOSIS — G8929 Other chronic pain: Secondary | ICD-10-CM

## 2017-05-29 DIAGNOSIS — E559 Vitamin D deficiency, unspecified: Secondary | ICD-10-CM | POA: Insufficient documentation

## 2017-05-29 DIAGNOSIS — Z91018 Allergy to other foods: Secondary | ICD-10-CM | POA: Insufficient documentation

## 2017-05-29 DIAGNOSIS — M130 Polyarthritis, unspecified: Secondary | ICD-10-CM | POA: Insufficient documentation

## 2017-05-29 DIAGNOSIS — Z87891 Personal history of nicotine dependence: Secondary | ICD-10-CM | POA: Insufficient documentation

## 2017-05-29 DIAGNOSIS — Z823 Family history of stroke: Secondary | ICD-10-CM | POA: Insufficient documentation

## 2017-05-29 DIAGNOSIS — Z9889 Other specified postprocedural states: Secondary | ICD-10-CM | POA: Insufficient documentation

## 2017-05-29 DIAGNOSIS — Z23 Encounter for immunization: Secondary | ICD-10-CM

## 2017-05-29 DIAGNOSIS — N289 Disorder of kidney and ureter, unspecified: Secondary | ICD-10-CM | POA: Insufficient documentation

## 2017-05-29 DIAGNOSIS — Z91040 Latex allergy status: Secondary | ICD-10-CM | POA: Insufficient documentation

## 2017-05-29 DIAGNOSIS — R1110 Vomiting, unspecified: Secondary | ICD-10-CM | POA: Insufficient documentation

## 2017-05-29 DIAGNOSIS — F319 Bipolar disorder, unspecified: Secondary | ICD-10-CM | POA: Insufficient documentation

## 2017-05-29 MED ORDER — NORTRIPTYLINE HCL 10 MG PO CAPS: 10.0000 mg | ORAL_CAPSULE | Freq: Every day | ORAL | 1 refills | Status: AC

## 2017-05-29 MED ORDER — POTASSIUM CHLORIDE CRYS ER 20 MEQ PO TBCR: 20.0000 meq | EXTENDED_RELEASE_TABLET | Freq: Two times a day (BID) | ORAL | 2 refills | Status: AC

## 2017-05-29 MED ORDER — VITAMIN D (CHOLECALCIFEROL) 10 MCG (400 UNIT) PO CHEW: 400.0000 [IU] | CHEWABLE_TABLET | Freq: Every morning | ORAL | 1 refills | Status: AC

## 2017-05-29 NOTE — Patient Instructions (Addendum)
Please let her see Brandi Howard today to get GI and psychiatry appts set up.  Start Nortriptyline at bedtime to help decrease jaw pain.   Td Vaccine (Tetanus and Diphtheria): What You Need to Know  1. Why get vaccinated? Tetanus  and diphtheria are very serious diseases. They are rare in the [place] today, but people who do become infected often have severe complications. Td vaccine is used to protect adolescents and adults from both of these diseases. Both tetanus and diphtheria are infections caused by bacteria. Diphtheria spreads from person to person through coughing or sneezing. Tetanus-causing bacteria enter the body through cuts, scratches, or wounds. TETANUS (lockjaw) causes painful muscle tightening and stiffness, usually all over the body.  It can lead to tightening of muscles in the head and neck so you can't open your mouth, swallow, or sometimes even breathe. Tetanus kills about 1 out of every 10 people who are infected even after receiving the best medical care.  DIPHTHERIA can cause a thick coating to form in the back of the throat.  It can lead to breathing problems, paralysis, heart failure, and death.  Before vaccines, as many as 200,000 cases of diphtheria and hundreds of cases of tetanus were reported in the [place] each year. Since vaccination began, reports of cases for both diseases have dropped by about 99%. 2. Td vaccine Td vaccine can protect adolescents and adults from tetanus and diphtheria. Td is usually given as a booster dose every 10 years but it can also be given earlier after a severe and dirty wound or burn. Another vaccine, called Tdap, which protects against pertussis in addition to tetanus and diphtheria, is sometimes recommended instead of Td vaccine. Your doctor or the person giving you the vaccine can give you more information. Td may safely be given at the same time as other vaccines. 3. Some people should not get this vaccine  A person who has  ever had a life-threatening allergic reaction after a previous dose of any tetanus or diphtheria containing vaccine, OR has a severe allergy to any part of this vaccine, should not get Td vaccine. Tell the person giving the vaccine about any severe allergies.  Talk to your doctor if you: ? had severe pain or swelling after any vaccine containing diphtheria or tetanus, ? ever had a condition called Guillain Barre Syndrome (GBS), ? aren't feeling well on the day the shot is scheduled. 4. What are the risks from Td vaccine? With any medicine, including vaccines, there is a chance of side effects. These are usually mild and go away on their own. Serious reactions are also possible but are rare. Most people who get Td vaccine do not have any problems with it. Mild problems following Td vaccine: (Did not interfere with activities)  Pain where the shot was given (about 8 people in 10)  Redness or swelling where the shot was given (about 1 person in 4)  Mild fever (rare)  Headache (about 1 person in 4)  Tiredness (about 1 person in 4)  Moderate problems following Td vaccine: (Interfered with activities, but did not require medical attention)  Fever over 102F (rare)  Severe problems following Td vaccine: (Unable to perform usual activities; required medical attention)  Swelling, severe pain, bleeding and/or redness in the arm where the shot was given (rare).  Problems that could happen after any vaccine:  People sometimes faint after a medical procedure, including vaccination. Sitting or lying down for about 15 minutes can help prevent  fainting, and injuries caused by a fall. Tell your doctor if you feel dizzy, or have vision changes or ringing in the ears.  Some people get severe pain in the shoulder and have difficulty moving the arm where a shot was given. This happens very rarely.  Any medication can cause a severe allergic reaction. Such reactions from a vaccine are very rare,  estimated at fewer than 1 in a million doses, and would happen within a few minutes to a few hours after the vaccination. As with any medicine, there is a very remote chance of a vaccine causing a serious injury or death. The safety of vaccines is always being monitored. For more information, visit: http://floyd.org/ 5. What if there is a serious reaction? What should I look for? Look for anything that concerns you, such as signs of a severe allergic reaction, very high fever, or unusual behavior. Signs of a severe allergic reaction can include hives, swelling of the face and throat, difficulty breathing, a fast heartbeat, dizziness, and weakness. These would usually start a few minutes to a few hours after the vaccination. What should I do?  If you think it is a severe allergic reaction or other emergency that can't wait, call 9-1-1 or get the person to the nearest hospital. Otherwise, call your doctor.  Afterward, the reaction should be reported to the Vaccine Adverse Event Reporting System (VAERS). Your doctor might file this report, or you can do it yourself through the VAERS web site at www.vaers.LAgents.no, or by calling 1-(204) 835-3385. ? VAERS does not give medical advice. 6. The National Vaccine Injury Compensation Program The [employer] Vaccine Injury Compensation Program (VICP) is a federal program that was created to compensate people who may have been injured by certain vaccines. Persons who believe they may have been injured by a vaccine can learn about the program and about filing a claim by calling 1-(854)804-3388 or visiting the VICP website at SpiritualWord.at. There is a time limit to file a claim for compensation. 7. How can I learn more?  Ask your doctor. He or she can give you the vaccine package insert or suggest other sources of information.  Call your local or state health department.  Contact the Centers for Disease Control and Prevention  (CDC): ? Call 508-733-4003 (1-800-CDC-INFO) ? Visit CDC's website at PicCapture.uy CDC Td Vaccine VIS (02/23/16) This information is not intended to replace advice given to you by your health care provider. Make sure you discuss any questions you have with your health care provider. Document Released: 08/28/2006 Document Revised: 07/21/2016 Document Reviewed: 07/21/2016 Elsevier Interactive Patient Education  2017 [employer].

## 2017-05-29 NOTE — Progress Notes (Signed)
Patient ID: Brandi Howard, female    DOB: 11/28/1985  MRN: [id number]  CC: Hospitalization Follow-up and Follow-up   Subjective: Brandi Howard is a 31 y.o. female who presents for chronic ds management. Her concerns today include:  Patient is a [age] year old female who was last seen by me the end of May with persistent vomiting, weight loss, dental pain, low potassium, polyarthralgia and history of ADD and bipolar. Patient was referred to dentist, GI and nephrology. Anti-CCP and sedimentation rates were normal. Vitamin D level was low at 21. Potassium level was low. She did fill rxn for potassium and is taking.  1. Seen in ER recently for dental pain.  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 Hurts to chew. "It hurts period. It hurts to talk." -has dental appt next wk  2. C/o bad acid reflux -taking Protonix and Zofran -" anything I eat makes me sick." States she continues to have episodes of vomiting wgh has remained stable based on documented readings.  However, pt states she had gotten up to 150 lbs since last visit with me -await GI appt.  Has orange card.   3.  Did not get message of low Vit D Not taking supplement   Patient Active Problem List   Diagnosis Date Noted  . Persistent vomiting 04/06/2017  . Polyarthritis of multiple sites 04/06/2017  . Dental cavities 04/06/2017  . Renal insufficiency 04/06/2017  . Hx of bipolar disorder 04/06/2017  . Attention deficit disorder 04/06/2017     Current Outpatient Prescriptions on File Prior to Visit  Medication Sig Dispense Refill  . albuterol (PROVENTIL HFA;VENTOLIN HFA) 108 (90 Base) MCG/ACT inhaler Inhale 2 puffs into the lungs every 6 (six) hours as needed for wheezing or shortness of breath. 1 Inhaler 6  . dicyclomine (BENTYL) 10 MG capsule Take 1 capsule (10 mg total) by mouth 4 (four) times daily -  before meals and at bedtime. 120 capsule 0  .  divalproex (DEPAKOTE) 500 MG DR tablet Take 1 tablet (500 mg total) by mouth 2 (two) times daily. 60 tablet 1  . ibuprofen (ADVIL,MOTRIN) 800 MG tablet Take 1 tablet (800 mg total) by mouth 3 (three) times daily. (Patient not taking: Reported on 05/29/2017) 21 tablet 0  . lisdexamfetamine (VYVANSE) 20 MG capsule Take 1 capsule (20 mg total) by mouth daily. 30 capsule 0  . medroxyPROGESTERone (DEPO-PROVERA) 150 MG/ML injection Inject 150 mg into the muscle every 3 (three) months.    . ondansetron (ZOFRAN ODT) 4 MG disintegrating tablet Take 1 tablet (4 mg total) by mouth every 8 (eight) hours as needed for nausea or vomiting. 30 tablet 2  . ondansetron (ZOFRAN) 4 MG/5ML solution Take 5 mLs (4 mg total) by mouth every 8 (eight) hours as needed for nausea or vomiting. (Patient not taking: Reported on 04/06/2017) 50 mL 1  . pantoprazole (PROTONIX) 40 MG tablet Take 1 tablet (40 mg total) by mouth daily. 30 tablet 1  . traZODone (DESYREL) 100 MG tablet Take 0.5 tablets (50 mg total) by mouth at bedtime as needed for sleep. 30 tablet 1  . venlafaxine (EFFEXOR) 75 MG tablet Take 1 tablet (75 mg total) by mouth 2 (two) times daily with a meal. 60 tablet 3   No current facility-administered medications on file prior to visit.     Allergies  Allergen Reactions  . Coconut Oil Anaphylaxis  . Saphris [Asenapine] Other (See Comments)  Caused Canker sores in mouth  . Pineapple Other (See Comments)    Blisters inside mouth  . Latex Rash    Social History   Social History  . Marital status: Single    Spouse name: N/A  . Number of children: N/A  . Years of education: N/A   Occupational History  . Not on file.   Social History Main Topics  . Smoking status: Former Games developer  . Smokeless tobacco: Former Neurosurgeon  . Alcohol use No  . Drug use: No     Comment: quit April 2018  . Sexual activity: Yes    Partners: Female    Birth control/ protection: None   Other Topics Concern  . Not on file    Social History Narrative  . No narrative on file    Family History  Problem Relation Age of Onset  . Heart failure Mother   . Stroke Mother   . Cervical cancer Mother   . Hypertension Mother   . Hyperlipidemia Mother   . Hypertension Father   . Hyperlipidemia Father     Past Surgical History:  Procedure Laterality Date  . TONSILLECTOMY      ROS: Review of Systems As stated above  PHYSICAL EXAM: BP 131/70   Pulse 63   Temp 97.8 F (36.6 C) (Oral)   Resp 16   Wt 141 lb 9.6 oz (64.2 kg)   SpO2 97%   BMI 23.56 kg/m   Wt Readings from Last 3 Encounters:  05/29/17 141 lb 9.6 oz (64.2 kg)  04/06/17 142 lb 6.4 oz (64.6 kg)  03/06/17 142 lb (64.4 kg)    Physical Exam General appearance - young female in NAD.   Mental status - alert, oriented to person, place, and time Mouth - mucous membranes moist, pharynx normal without lesions. She is exhibits discomfort when asked to open mouth and with biting down. + plaque build up especially of front lower teeth Neck - supple, no significant adenopathy Chest - clear to auscultation, no wheezes, rales or rhonchi, symmetric air entry Heart - normal rate, regular rhythm, normal S1, S2, no murmurs, rubs, clicks or gallops Abdomen - normal bowel sounds, soft. Mild tenderness throughout  ASSESSMENT AND PLAN: 1. 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  Warm compresses to jaw 30 minutes prior to meals - nortriptyline (PAMELOR) 10 MG capsule; Take 1 capsule (10 mg total) by mouth at bedtime. For chronic jaw pain  Dispense: 30 capsule; Refill: 1  2. GERD without esophagitis See #4 below  3. Vitamin D deficiency - Vitamin D, Cholecalciferol, 400 units CHEW; Chew 1 tablet (400 Units total) by mouth every morning.  Dispense: 100 tablet; Refill: 1  4. Persistent vomiting -will check with referral specialist today about GI referral Continue Zofran and Protonix - potassium chloride SA (K-DUR,KLOR-CON) 20 MEQ tablet; Take 1 tablet (20 mEq total)  by mouth 2 (two) times daily.  Dispense: 60 tablet; Refill: 2  5. Need for Tdap vaccination - Tdap vaccine greater than or equal to 7yo IM  Patient was given the opportunity to ask questions.  Patient verbalized understanding of the plan and was able to repeat key elements of the plan.   Orders Placed This Encounter  Procedures  . Tdap vaccine greater than or equal to 7yo IM     Requested Prescriptions   Signed Prescriptions Disp Refills  . potassium chloride SA (K-DUR,KLOR-CON) 20 MEQ tablet 60 tablet 2    Sig: Take 1 tablet (20 mEq total) by mouth  2 (two) times daily.  . nortriptyline (PAMELOR) 10 MG capsule 30 capsule 1    Sig: Take 1 capsule (10 mg total) by mouth at bedtime. For chronic jaw pain  . Vitamin D, Cholecalciferol, 400 units CHEW 100 tablet 1    Sig: Chew 1 tablet (400 Units total) by mouth every morning.    Return if symptoms worsen or fail to improve.  Jonah Blue, MD, FACP

## 2017-06-05 ENCOUNTER — Other Ambulatory Visit: Payer: Self-pay | Admitting: Internal Medicine

## 2017-06-09 NOTE — Telephone Encounter (Signed)
[facility] called stating that pt. Had an appt. Today and did not show up.

## 2018-07-15 ENCOUNTER — Emergency Department (HOSPITAL_COMMUNITY): Payer: Self-pay

## 2018-07-15 ENCOUNTER — Emergency Department (HOSPITAL_COMMUNITY)
Admission: EM | Admit: 2018-07-15 | Discharge: 2018-07-15 | Disposition: A | Discharge status: Home / Self Care | Payer: Self-pay | Attending: Emergency Medicine | Admitting: Emergency Medicine

## 2018-07-15 ENCOUNTER — Encounter (HOSPITAL_COMMUNITY): Payer: Self-pay | Admitting: Emergency Medicine

## 2018-07-15 DIAGNOSIS — J45909 Unspecified asthma, uncomplicated: Secondary | ICD-10-CM | POA: Insufficient documentation

## 2018-07-15 DIAGNOSIS — M87039 Idiopathic aseptic necrosis of unspecified carpus: Secondary | ICD-10-CM

## 2018-07-15 DIAGNOSIS — M8708 Idiopathic aseptic necrosis of bone, other site: Secondary | ICD-10-CM | POA: Insufficient documentation

## 2018-07-15 DIAGNOSIS — Z87891 Personal history of nicotine dependence: Secondary | ICD-10-CM | POA: Insufficient documentation

## 2018-07-15 DIAGNOSIS — Z91040 Latex allergy status: Secondary | ICD-10-CM | POA: Insufficient documentation

## 2018-07-15 MED ORDER — IBUPROFEN 200 MG PO TABS
600.0000 mg | ORAL_TABLET | Freq: Once | ORAL | Status: AC
  Administered 2018-07-15: 600 mg via ORAL
  Filled 2018-07-15: qty 3

## 2018-07-15 MED ORDER — IBUPROFEN 600 MG PO TABS: 600.0000 mg | ORAL_TABLET | Freq: Three times a day (TID) | ORAL | 0 refills | Status: AC | PRN

## 2018-07-15 NOTE — ED Notes (Signed)
Xray called initial wrist xray canceled. See new orders
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
## 2018-07-15 NOTE — ED Notes (Signed)
Pt coming back at 1200 to get splint from OR tech

## 2018-07-15 NOTE — Discharge Instructions (Addendum)
Take medications as prescribed.  Wear the splint for comfort.  Consider following up with an orthopedic doctor regarding the avascular necrosis of the lunate bone
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
## 2020-08-16 IMAGING — CR DG FOREARM 2V*R*
2 series · 2 of 2 positions shown · non-contrast
Comparison: None.

CLINICAL DATA: Right forearm pain due to a fall yesterday. Initial
encounter.

EXAM:
RIGHT FOREARM - 2 VIEW

[x forearm ap right]
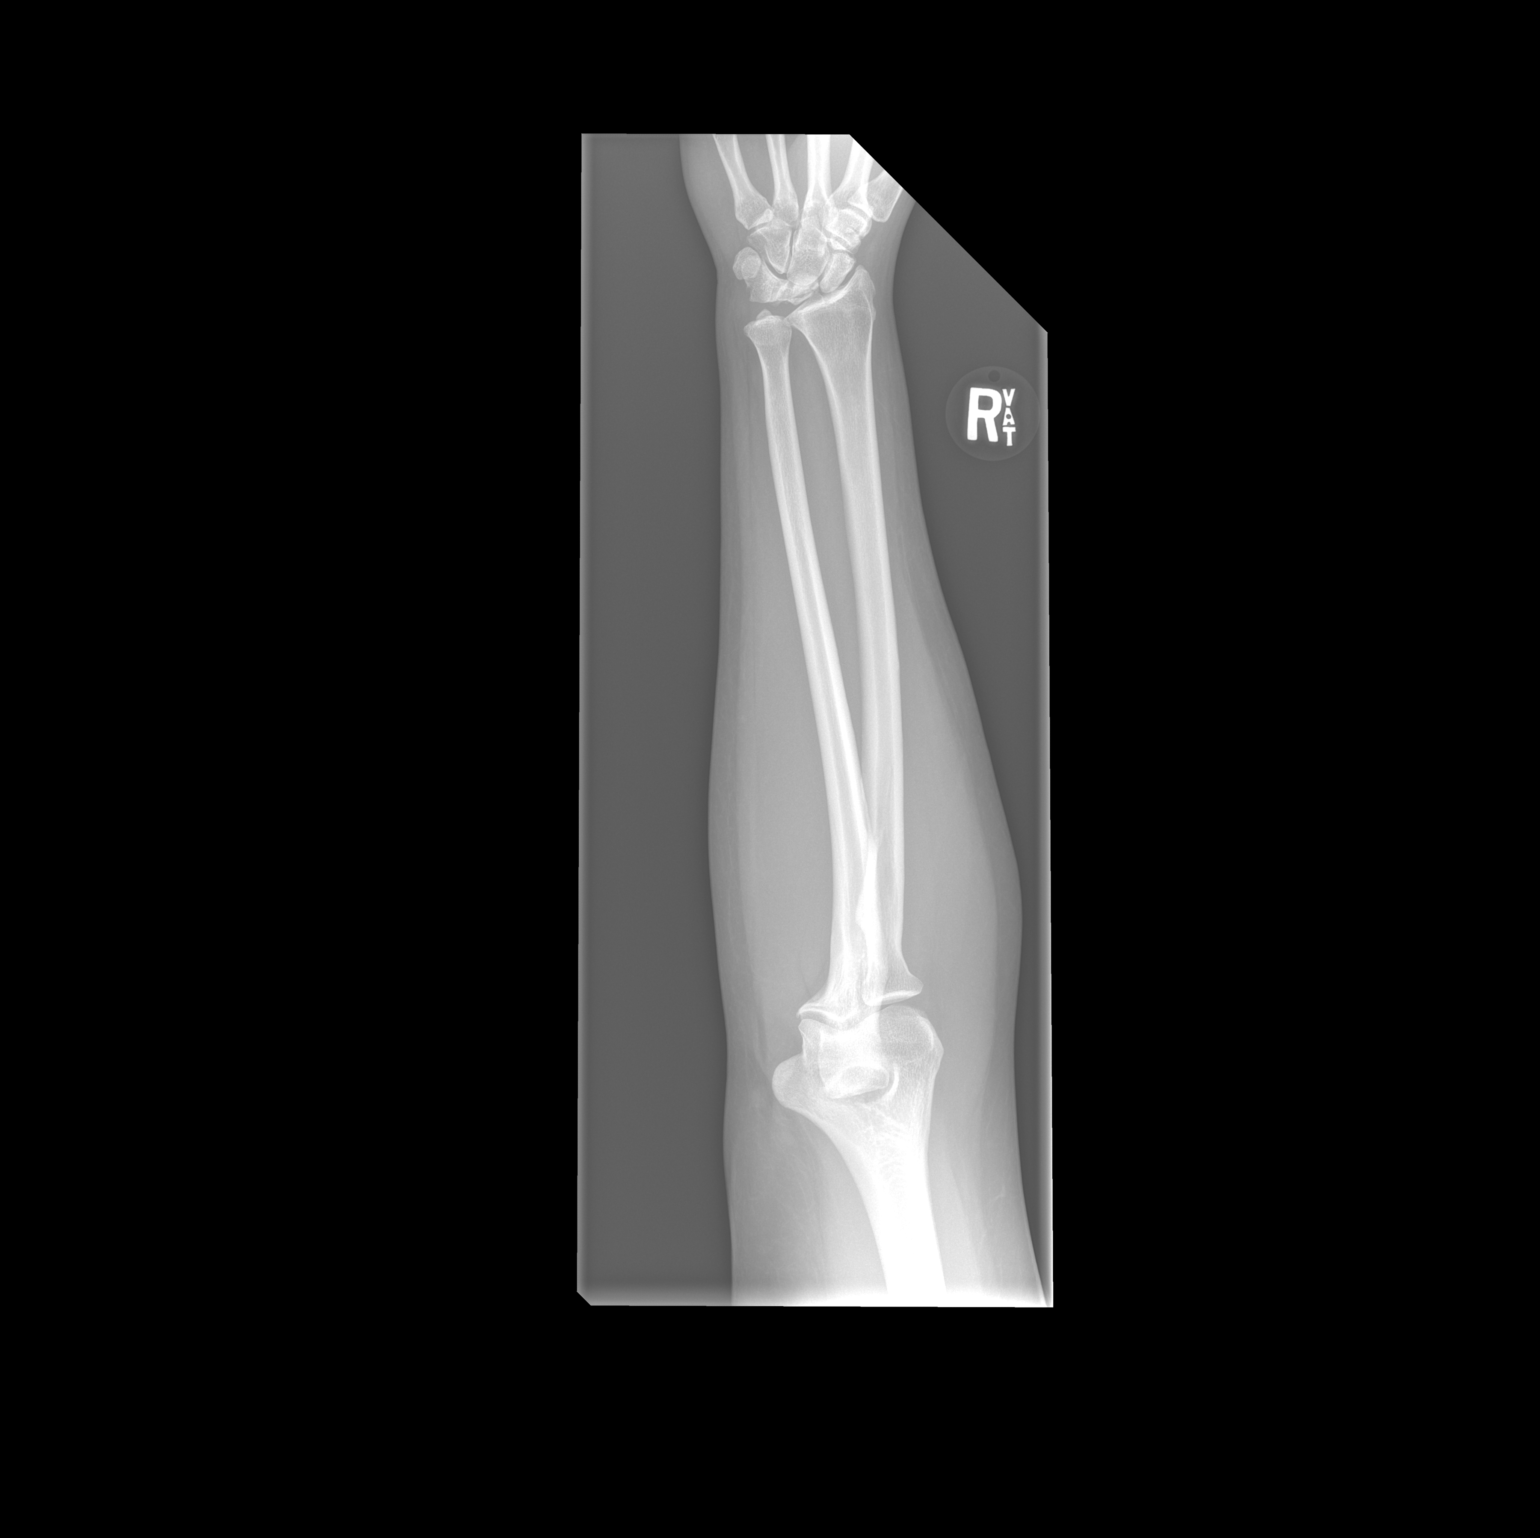

[x forearm lat right]
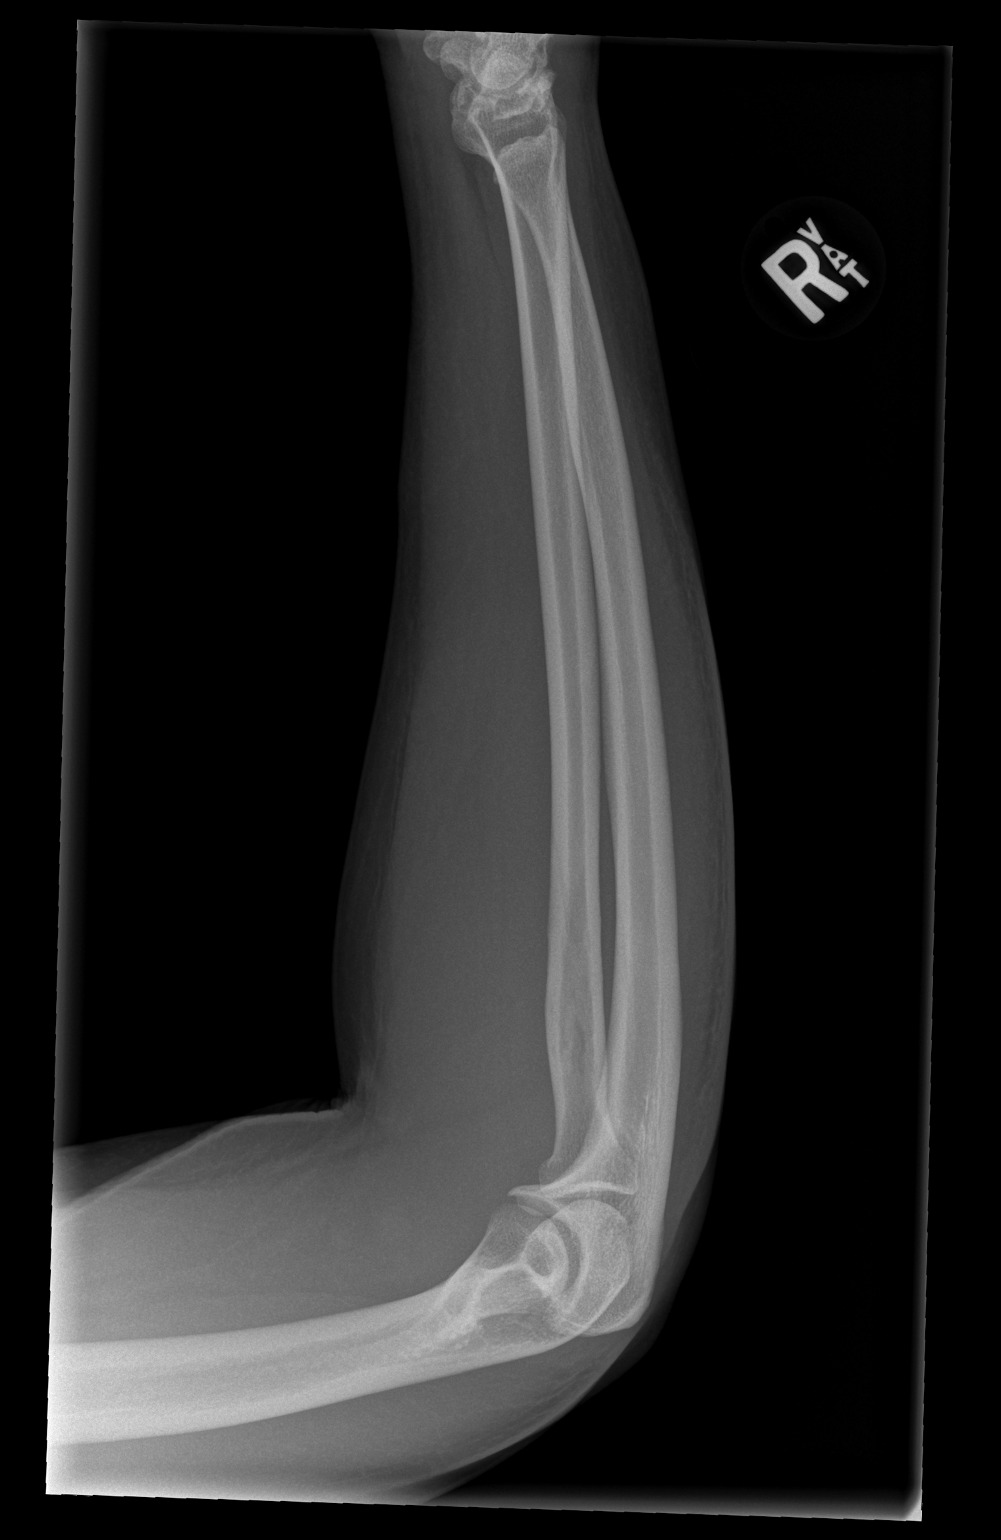

[2 of 2 positions shown; findings below may reference images not displayed]

FINDINGS: There is no evidence of fracture or other focal bone lesions. Soft
tissues are unremarkable.
IMPRESSION: Normal exam.

## 2020-08-16 IMAGING — CR DG HAND COMPLETE 3+V*R*
3 series · 3 of 3 positions shown · non-contrast
Comparison: Plain films right wrist 02/17/2017.

CLINICAL DATA: Right hand pain due to a fall yesterday. Initial
encounter.

EXAM:
RIGHT HAND - COMPLETE 3+ VIEW

[x hand pa right]
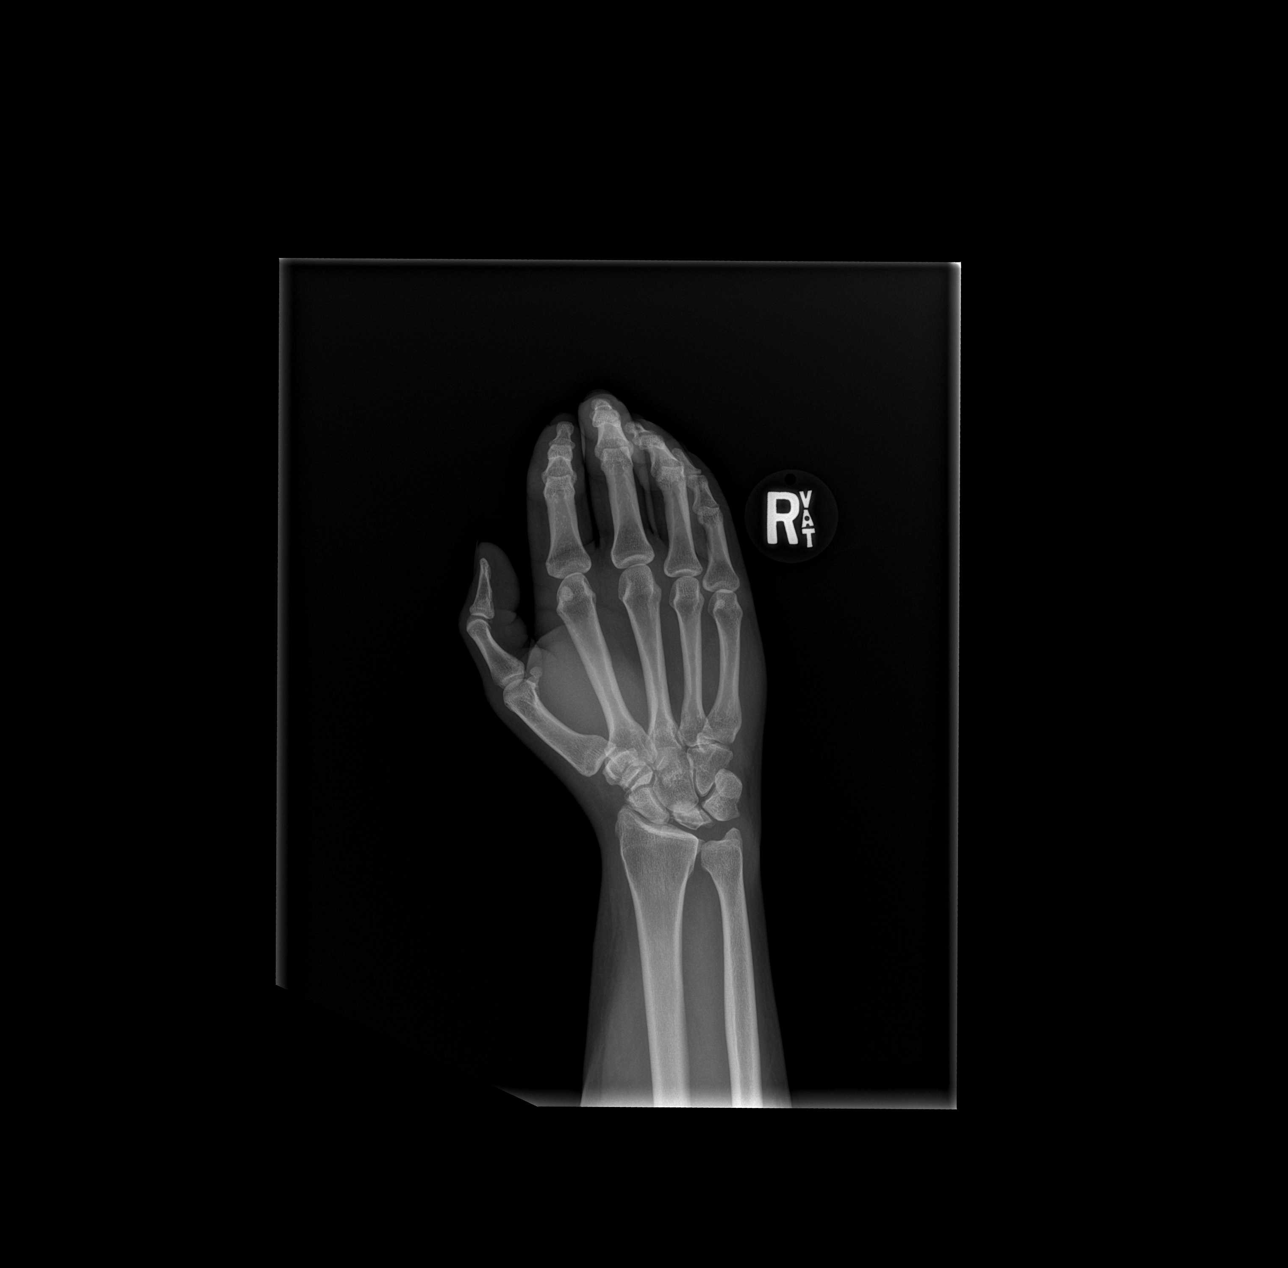

[x hand obl right]
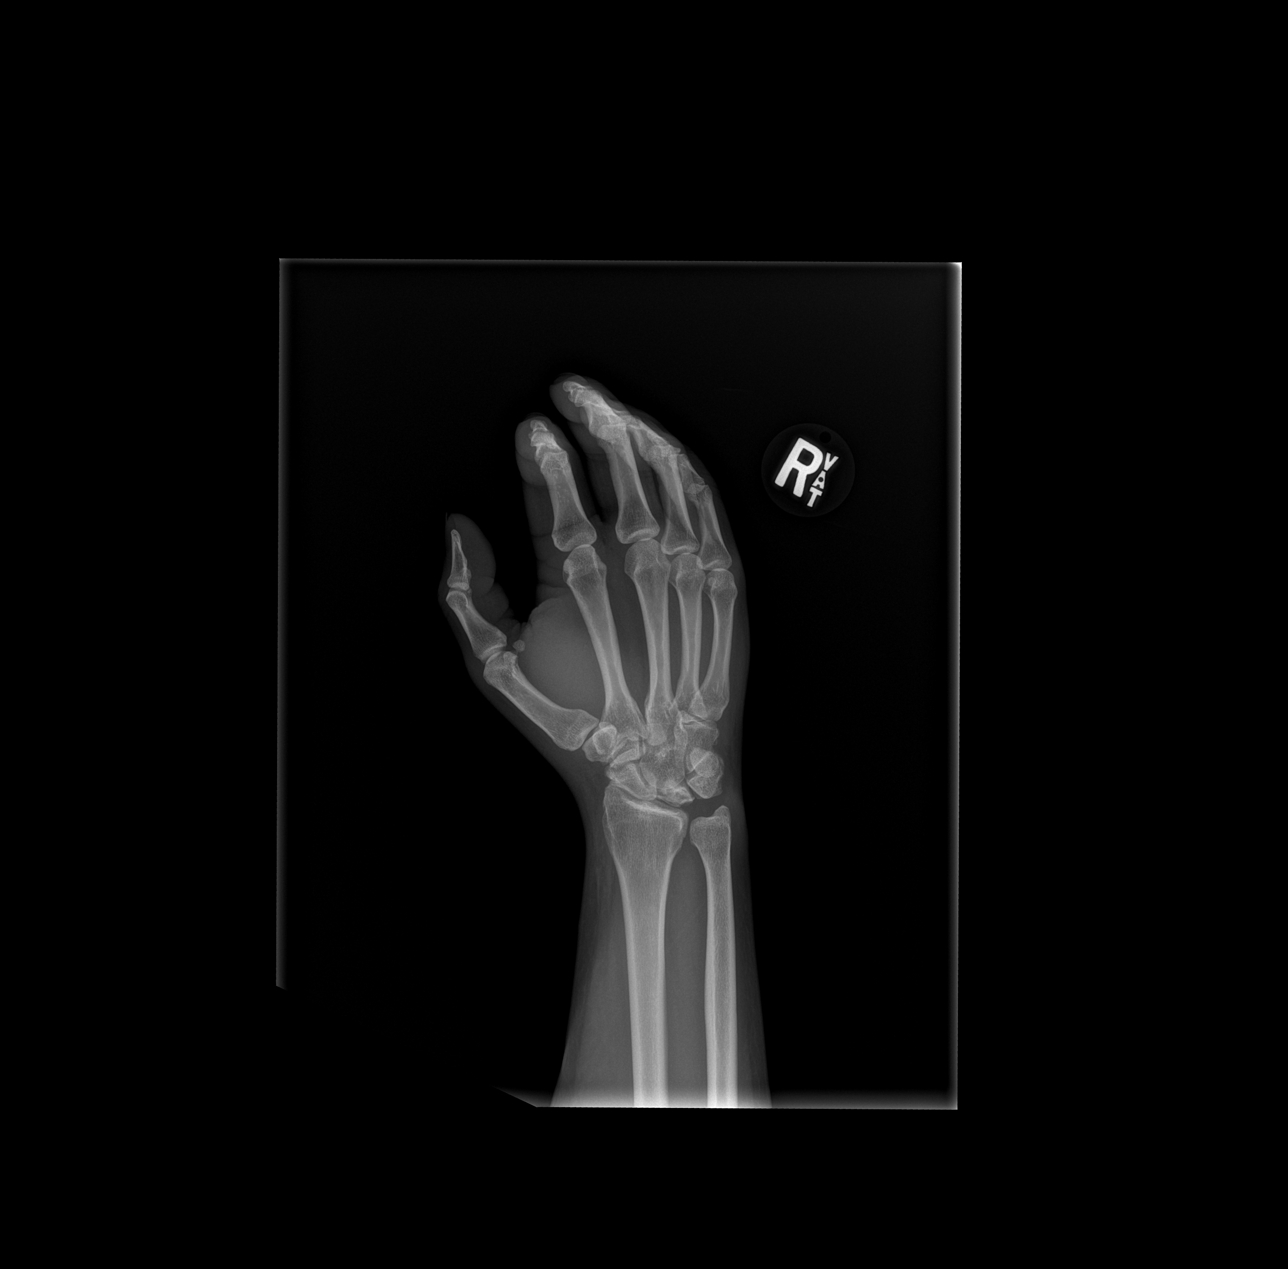

[x hand lat right]
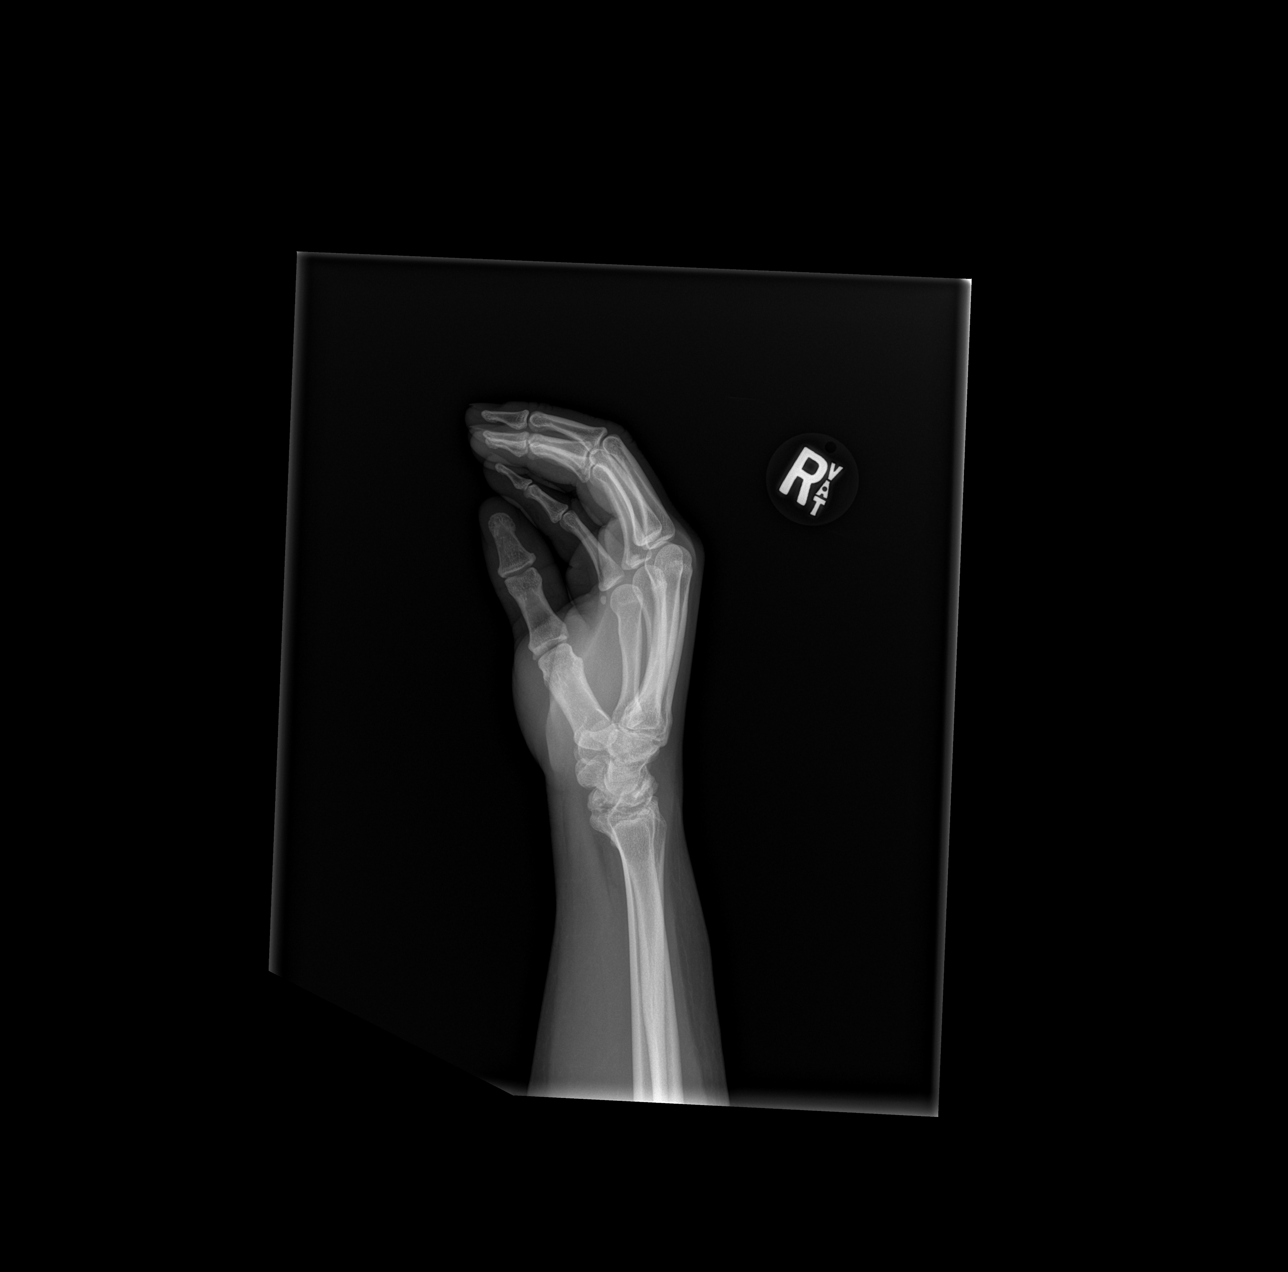

[3 of 3 positions shown; findings below may reference images not displayed]

FINDINGS: No acute bony or joint abnormality is identified. The lunate is
flattened and sclerotic consistent with lunatomalacia, unchanged
compared to the prior examination. Mild ulnar minus variance is
seen. Soft tissues are unremarkable.
IMPRESSION: No acute abnormality.

Unchanged flattened and sclerotic lunate consistent with Kienbock's
disease in this patient with ulnar minus variance.
